# Patient Record
Sex: Female | Born: 1985 | Hispanic: Yes | Marital: Single | Smoking: Never smoker
Health system: Southern US, Community
[De-identification: ages and names within clinical notes are randomized; demographics above are authoritative.]

## PROBLEM LIST (undated history)

## (undated) DIAGNOSIS — K429 Umbilical hernia without obstruction or gangrene: Secondary | ICD-10-CM

## (undated) DIAGNOSIS — O24419 Gestational diabetes mellitus in pregnancy, unspecified control: Secondary | ICD-10-CM

## (undated) HISTORY — PX: NO PAST SURGERIES: SHX2092

## (undated) HISTORY — DX: Umbilical hernia without obstruction or gangrene: K42.9

## (undated) HISTORY — DX: Gestational diabetes mellitus in pregnancy, unspecified control: O24.419

---

## 2007-09-08 ENCOUNTER — Ambulatory Visit (HOSPITAL_COMMUNITY): Admission: RE | Admit: 2007-09-08 | Discharge: 2007-09-08 | Payer: Self-pay | Admitting: Family Medicine

## 2007-12-21 ENCOUNTER — Ambulatory Visit (HOSPITAL_COMMUNITY): Admission: RE | Admit: 2007-12-21 | Discharge: 2007-12-21 | Payer: Self-pay | Admitting: Family Medicine

## 2008-01-03 ENCOUNTER — Inpatient Hospital Stay (HOSPITAL_COMMUNITY): Admission: AD | Admit: 2008-01-03 | Discharge: 2008-01-06 | Payer: Self-pay | Admitting: Obstetrics & Gynecology

## 2008-01-03 ENCOUNTER — Ambulatory Visit: Payer: Self-pay | Admitting: Physician Assistant

## 2011-02-28 LAB — CBC
HCT: 40.2
MCV: 94.5
Platelets: 165
RDW: 13.5

## 2011-02-28 LAB — RPR: RPR Ser Ql: NONREACTIVE

## 2011-06-03 NOTE — L&D Delivery Note (Signed)
Delivery Note At 2:20 AM a viable female was delivered via Vaginal, Spontaneous Delivery (Presentation: ; Left Occiput Anterior).  APGAR: 8, 10; weight 8 lb 6 oz (3799 g).   Placenta status: Intact, Spontaneous.  Cord: 3 vessels   Anesthesia: None  Episiotomy: None Lacerations: 1st degree perineal Suture Repair: 3.0 vicryl Est. Blood Loss (mL): 350  Mom to postpartum.  Baby to nursery-stable.  Cam Hai 11/26/2011, 2:53 AM

## 2011-06-09 ENCOUNTER — Other Ambulatory Visit (HOSPITAL_COMMUNITY): Payer: Self-pay | Admitting: Physician Assistant

## 2011-06-09 DIAGNOSIS — Z3689 Encounter for other specified antenatal screening: Secondary | ICD-10-CM

## 2011-06-09 LAB — OB RESULTS CONSOLE GC/CHLAMYDIA: Chlamydia: NEGATIVE

## 2011-06-09 LAB — OB RESULTS CONSOLE RUBELLA ANTIBODY, IGM: Rubella: IMMUNE

## 2011-06-09 LAB — OB RESULTS CONSOLE HEPATITIS B SURFACE ANTIGEN: Hepatitis B Surface Ag: NEGATIVE

## 2011-06-09 LAB — OB RESULTS CONSOLE ABO/RH: RH Type: POSITIVE

## 2011-06-09 LAB — OB RESULTS CONSOLE RPR: RPR: NONREACTIVE

## 2011-07-11 ENCOUNTER — Ambulatory Visit (HOSPITAL_COMMUNITY)
Admission: RE | Admit: 2011-07-11 | Discharge: 2011-07-11 | Disposition: A | Discharge status: Home / Self Care | Payer: Medicaid Other | Source: Ambulatory Visit | Attending: Physician Assistant | Admitting: Physician Assistant

## 2011-07-11 DIAGNOSIS — O358XX0 Maternal care for other (suspected) fetal abnormality and damage, not applicable or unspecified: Secondary | ICD-10-CM | POA: Insufficient documentation

## 2011-07-11 DIAGNOSIS — Z3689 Encounter for other specified antenatal screening: Secondary | ICD-10-CM

## 2011-07-11 DIAGNOSIS — Z1389 Encounter for screening for other disorder: Secondary | ICD-10-CM | POA: Insufficient documentation

## 2011-07-11 DIAGNOSIS — Z363 Encounter for antenatal screening for malformations: Secondary | ICD-10-CM | POA: Insufficient documentation

## 2011-11-19 ENCOUNTER — Other Ambulatory Visit (HOSPITAL_COMMUNITY): Payer: Self-pay | Admitting: Physician Assistant

## 2011-11-19 DIAGNOSIS — O480 Post-term pregnancy: Secondary | ICD-10-CM

## 2011-11-21 ENCOUNTER — Telehealth (HOSPITAL_COMMUNITY): Payer: Self-pay | Admitting: *Deleted

## 2011-11-21 ENCOUNTER — Encounter (HOSPITAL_COMMUNITY): Payer: Self-pay | Admitting: *Deleted

## 2011-11-21 NOTE — Telephone Encounter (Signed)
[id number] interpreter number Preadmission screen

## 2011-11-24 ENCOUNTER — Ambulatory Visit (HOSPITAL_COMMUNITY)
Admission: RE | Admit: 2011-11-24 | Discharge: 2011-11-24 | Disposition: A | Discharge status: Home / Self Care | Payer: Medicaid Other | Source: Ambulatory Visit | Attending: Physician Assistant | Admitting: Physician Assistant

## 2011-11-24 DIAGNOSIS — O480 Post-term pregnancy: Secondary | ICD-10-CM | POA: Insufficient documentation

## 2011-11-24 DIAGNOSIS — Z3689 Encounter for other specified antenatal screening: Secondary | ICD-10-CM | POA: Insufficient documentation

## 2011-11-26 ENCOUNTER — Encounter (HOSPITAL_COMMUNITY): Payer: Self-pay | Admitting: *Deleted

## 2011-11-26 ENCOUNTER — Inpatient Hospital Stay (HOSPITAL_COMMUNITY): Admission: RE | Admit: 2011-11-26 | Payer: Self-pay | Source: Ambulatory Visit | Admitting: Obstetrics & Gynecology

## 2011-11-26 ENCOUNTER — Inpatient Hospital Stay (HOSPITAL_COMMUNITY)
Admission: AD | Admit: 2011-11-26 | Discharge: 2011-11-28 | DRG: 775 | Disposition: A | Discharge status: Home / Self Care | Payer: Medicaid Other | Source: Ambulatory Visit | Attending: Obstetrics & Gynecology | Admitting: Obstetrics & Gynecology

## 2011-11-26 LAB — TYPE AND SCREEN
ABO/RH(D): O POS
Antibody Screen: NEGATIVE

## 2011-11-26 LAB — CBC
HCT: 39.7 % (ref 36.0–46.0)
Platelets: 141 10*3/uL — ABNORMAL LOW (ref 150–400)
RBC: 4.42 MIL/uL (ref 3.87–5.11)
RDW: 14.3 % (ref 11.5–15.5)
WBC: 9.5 10*3/uL (ref 4.0–10.5)

## 2011-11-26 MED ORDER — FLEET ENEMA 7-19 GM/118ML RE ENEM: 1.0000 | ENEMA | RECTAL | Status: DC | PRN

## 2011-11-26 MED ORDER — NALBUPHINE SYRINGE 5 MG/0.5 ML: 10.0000 mg | INJECTION | INTRAMUSCULAR | Status: DC | PRN

## 2011-11-26 MED ORDER — PRENATAL MULTIVITAMIN CH
1.0000 | ORAL_TABLET | Freq: Every day | ORAL | Status: DC
  Administered 2011-11-26 – 2011-11-28 (×3): 1 via ORAL
  Filled 2011-11-26 (×3): qty 1

## 2011-11-26 MED ORDER — IBUPROFEN 600 MG PO TABS
600.0000 mg | ORAL_TABLET | Freq: Four times a day (QID) | ORAL | Status: DC | PRN
  Administered 2011-11-26: 600 mg via ORAL
  Filled 2011-11-26: qty 1

## 2011-11-26 MED ORDER — OXYCODONE-ACETAMINOPHEN 5-325 MG PO TABS
1.0000 | ORAL_TABLET | ORAL | Status: DC | PRN
  Administered 2011-11-26: 1 via ORAL
  Filled 2011-11-26: qty 1

## 2011-11-26 MED ORDER — LACTATED RINGERS IV SOLN
INTRAVENOUS | Status: DC
  Administered 2011-11-26: 01:00:00 via INTRAVENOUS

## 2011-11-26 MED ORDER — ACETAMINOPHEN 325 MG PO TABS: 650.0000 mg | ORAL_TABLET | ORAL | Status: DC | PRN

## 2011-11-26 MED ORDER — BENZOCAINE-MENTHOL 20-0.5 % EX AERO
1.0000 "application " | INHALATION_SPRAY | CUTANEOUS | Status: DC | PRN
  Filled 2011-11-26: qty 56

## 2011-11-26 MED ORDER — OXYTOCIN 40 UNITS IN LACTATED RINGERS INFUSION - SIMPLE MED: 62.5000 mL/h | Freq: Once | INTRAVENOUS | Status: DC

## 2011-11-26 MED ORDER — ZOLPIDEM TARTRATE 5 MG PO TABS: 5.0000 mg | ORAL_TABLET | Freq: Every evening | ORAL | Status: DC | PRN

## 2011-11-26 MED ORDER — DIPHENHYDRAMINE HCL 25 MG PO CAPS: 25.0000 mg | ORAL_CAPSULE | Freq: Four times a day (QID) | ORAL | Status: DC | PRN

## 2011-11-26 MED ORDER — SENNOSIDES-DOCUSATE SODIUM 8.6-50 MG PO TABS
2.0000 | ORAL_TABLET | Freq: Every day | ORAL | Status: DC
  Administered 2011-11-26 – 2011-11-27 (×2): 2 via ORAL

## 2011-11-26 MED ORDER — ONDANSETRON HCL 4 MG/2ML IJ SOLN: 4.0000 mg | INTRAMUSCULAR | Status: DC | PRN

## 2011-11-26 MED ORDER — SIMETHICONE 80 MG PO CHEW: 80.0000 mg | CHEWABLE_TABLET | ORAL | Status: DC | PRN

## 2011-11-26 MED ORDER — OXYTOCIN BOLUS FROM INFUSION
250.0000 mL | Freq: Once | INTRAVENOUS | Status: DC
  Filled 2011-11-26: qty 500

## 2011-11-26 MED ORDER — LACTATED RINGERS IV SOLN: 500.0000 mL | INTRAVENOUS | Status: DC | PRN

## 2011-11-26 MED ORDER — LANOLIN HYDROUS EX OINT: TOPICAL_OINTMENT | CUTANEOUS | Status: DC | PRN

## 2011-11-26 MED ORDER — ONDANSETRON HCL 4 MG/2ML IJ SOLN: 4.0000 mg | Freq: Four times a day (QID) | INTRAMUSCULAR | Status: DC | PRN

## 2011-11-26 MED ORDER — ONDANSETRON HCL 4 MG PO TABS: 4.0000 mg | ORAL_TABLET | ORAL | Status: DC | PRN

## 2011-11-26 MED ORDER — CITRIC ACID-SODIUM CITRATE 334-500 MG/5ML PO SOLN: 30.0000 mL | ORAL | Status: DC | PRN

## 2011-11-26 MED ORDER — DIBUCAINE 1 % RE OINT: 1.0000 "application " | TOPICAL_OINTMENT | RECTAL | Status: DC | PRN

## 2011-11-26 MED ORDER — TETANUS-DIPHTH-ACELL PERTUSSIS 5-2.5-18.5 LF-MCG/0.5 IM SUSP
0.5000 mL | Freq: Once | INTRAMUSCULAR | Status: AC
  Administered 2011-11-27: 0.5 mL via INTRAMUSCULAR
  Filled 2011-11-26: qty 0.5

## 2011-11-26 MED ORDER — WITCH HAZEL-GLYCERIN EX PADS: 1.0000 "application " | MEDICATED_PAD | CUTANEOUS | Status: DC | PRN

## 2011-11-26 MED ORDER — LIDOCAINE HCL (PF) 1 % IJ SOLN
INTRAMUSCULAR | Status: AC
  Filled 2011-11-26: qty 30

## 2011-11-26 MED ORDER — IBUPROFEN 600 MG PO TABS
600.0000 mg | ORAL_TABLET | Freq: Four times a day (QID) | ORAL | Status: DC
  Administered 2011-11-26 – 2011-11-28 (×8): 600 mg via ORAL
  Filled 2011-11-26 (×9): qty 1

## 2011-11-26 MED ORDER — LIDOCAINE HCL (PF) 1 % IJ SOLN: 30.0000 mL | INTRAMUSCULAR | Status: DC | PRN

## 2011-11-26 MED ORDER — OXYTOCIN 40 UNITS IN LACTATED RINGERS INFUSION - SIMPLE MED
INTRAVENOUS | Status: AC
  Administered 2011-11-26: 40 [IU]
  Filled 2011-11-26: qty 1000

## 2011-11-26 NOTE — H&P (Signed)
S: This is a 26 year old G2P1001 at [redacted]w[redacted]d by LMP presenting with contractions. Patient began developing ctx last night approx. 2230 that have progressed to 4-5 min apart lasting for approximately 1 minute. She was to be induced on 11/26/11 @ 0600. She presents to the MAU with spontaneous onset of labor with no ROM.  Complications this pregnancy: none  Contractions: q48min Membranes: intact Vaginal bleeding: none Vaginal discharge: mucous Fetal movement: present  O:  Filed Vitals:   11/26/11 0101  BP: 116/78  Pulse: 83  Temp: 97.4 F (36.3 C)  Resp: 16    Physical Examination: General appearance - alert, well appearing, and in no distress Mental status - alert, oriented to person, place, and time Eyes - pupils equal and reactive, extraocular eye movements intact Mouth - mucous membranes moist, pharynx normal without lesions Chest - clear to auscultation, no wheezes, rales or rhonchi, symmetric air entry Heart - normal rate, regular rhythm, normal S1, S2, no murmurs, rubs, clicks or gallops Abdomen - gravid, size c/w dates, soft, nontender, no masses or organomegaly Pelvic - NEFG, cervix fully dilated and effaced, station -1 Back exam - full range of motion, no tenderness, palpable spasm or pain on motion Neurological - alert, oriented, normal speech, no focal findings or movement disorder noted Musculoskeletal - no joint tenderness, deformity or swelling Extremities - pedal edema 2 + pitting B/L Skin - normal coloration and turgor, no rashes, no suspicious skin lesions noted  SVE: cervix 10cm, 100%, -1 Spec exam: not performed FHT: 151, mod variability, + accels, no decels Ctx: q3-4 min  Prenatal labs: ABO, Rh:  O positive Antibody:  Neg Rubella:  Immune RPR:   NR HBsAg: Neg  HIV:   Neg GBS:   Negative Hgb/Plt: pending 1hr GTT: 104 (normal) 1st trimester screen: QUAD  negative  Labs:  No results found for this or any previous visit (from the past 24  hour(s)).    A/P: 26 year old G2P1001 @ [redacted]w[redacted]d presents with spontaneous onset of labor 1. Admit to L&D 2. Expectant management, anticipate vaginal delivery 3. Antibiotics, none 4. Continuous toco/FHT  I have seen and examined this patient and I agree with the above. Cam Hai 3:14 AM 11/26/2011

## 2011-11-26 NOTE — H&P (Signed)
Attestation of Attending Supervision of Advanced Practitioner (CNM/NP): Evaluation and management procedures were performed by the Advanced Practitioner under my supervision and collaboration.  I have reviewed the Advanced Practitioner's note and chart, and I agree with the management and plan.  Couper Juncaj, M.D. 11/26/2011 7:42 AM  

## 2011-11-26 NOTE — MAU Note (Signed)
Pt. Started having uc's about 10:30 last night, now her uc's are 4-5 minutes apart. Pt. States that she was 3 cm in the office last week.

## 2011-11-26 NOTE — Progress Notes (Signed)
Ur chart review completed.  

## 2011-11-27 MED ORDER — IBUPROFEN 600 MG PO TABS: 600.0000 mg | ORAL_TABLET | Freq: Four times a day (QID) | ORAL | Status: AC | PRN

## 2011-11-27 NOTE — Progress Notes (Signed)
I have seen/examined this patient and agree with the above.  Caroline Pietsch E.  

## 2011-11-27 NOTE — Progress Notes (Signed)
S: Pt doing well. Pain controlled: Yes. Lochia: decreased.  Eating/drinking: Yes. Flatus: Yes. BM: No: . Voiding: Yes. Ambulating: No: . Breast feeding well: Yes.   O: Filed Vitals:   11/27/11 0525  BP: 94/56  Pulse: 70  Temp: 98.1 F (36.7 C)  Resp: 20    Gen: NAD, doing well CV: RRR Pulm: CTAB Abd: soft, + bowel sounds, fundus firm Ext: no edema  A/P: 26 y.o. year old G2P2002 PPD# 1 s/p SVD w/o complications -female/ breast/ birth control: IUD placement -Continue routine post-partum care. -Anticipate d/c PPD #2 -F/u in 6 weeks at HD

## 2011-11-27 NOTE — Discharge Instructions (Signed)
Cuidados luego de un parto por vía vaginal °(Postpartum Care After Vaginal Delivery) °Luego del nacimiento del bebé deberá permanecer en el hospital durante 24 a 72 horas, excepto que hubiera existido algún problema, o usted sufra alguna enfermedad. Mientras se encuentre en el hospital recibirá ayuda e instrucciones por parte de las enfermeras y el médico, quienes cuidarán de usted y su bebé y le darán consejos para amamantarlo correctamente, especialmente si es el primer hijo.  °En caso de ser necesario, le prescribirán analgésicos. Observará una pequeña hemorragia vaginal y deberá cambiar los apósitos con frecuencia. Lávese las manos cuidadosamente con agua y jabón durante al menos 20 segundos luego de cambiarse el apósito o ir al baño. Si elimina coágulos o aumenta la hemorragia, infórmelo a la enfermera. No deseche los coágulos sanguíneos antes de mostrárselos a la enfermera, para asegurarse de que no es tejido placentario. °Si le han colocado una vía intravenosa, se la retirarán dentro de las 24 horas, si no hay problemas. La primera vez que se levante de la cama o tome una ducha, llame a la enfermera para que la ayude que puede sentirse débil, mareada o desmayarse. Si está amamantando, puede sentir contracciones dolorosas en el útero durante algunas semanas. Esto es normal y necesario, ya que de este modo el útero vuelve a su tamaño normal. Si no está amamantando, utilice un sostén de soporte y trate de no tocarse las mamas hasta que haya dejado de producir leche. No deben administrarse hormonas para suprimir la leche, debido a que pueden causar coágulos sanguíneos. Podrá seguir una dieta normal, excepto que sufra diabetes o presente otros problemas de salud.  °La enfermera colocará bolsas con hielo en el sitio de la episiotomía (agrandamiento quirúrgico de la apertura vaginal) para reducir el dolor y la hinchazón. En algunos casos raros hay dificultad para orinar, entonces la enfermera deberá vaciarle la  vejiga con un catéter. Si le han practicado una ligadura tubaria durante el posparto ("trompas atadas", esterilización femenina), esto no hará que permanezca más tiempo en el hospital. °Podrá tener al bebé en su habitación todo el tiempo que lo desee si el bebé no tiene ningún problema. Lleve y traiga al bebé de la nursery dentro de la cunita. No lo lleve en brazos. No abandone el área de posparto. Si la madre es Rh negativa (falta de una proteína en los glóbulos rojos) y el bebé es Rh positivo, la madre debe aplicarse la vacuna RhoGam para evitar problemas con el factor Rh en futuros embarazos °Le darán instrucciones por escrito para usted y el bebé y los medicamentos necesarios cuando reciba el alta médica. Asegúrese que comprende y sigue las indicaciones. °INSTRUCCIONES PARA EL CUIDADO DOMICILIARIO °· Siga las instrucciones y tome los medicamentos que le indicaron cuando le dieron el alta médica.  °· Utilice los medicamentos de venta libre o de prescripción para el dolor, el malestar o la fiebre, según se lo indique el profesional que lo asiste.  °· No tome aspirina, ya que puede causar hemorragias.  °· Aumente sus actividades un poco cada día para tener más fuerza y resistencia.  °· No beba alcohol, especialmente si está amamantando o toma analgésicos.  °· Tómese la temperatura dos veces por día y regístrela.  °· Podrá tener una pequeña hemorragia durante 2 a 4 semanas. Esto es normal.  °· No utilice tampones o duchas vaginales, use toallas higiénicas.  °· Trate de que alguna persona permanezca con usted y la ayude durante los primeros días en el hogar.  °·   Descanse o duerma una siesta cuando el bebé duerma.  °· Si está amamantando, use un buen sostén. Si no está amamantando, use un buen sostén y no estimule los pezones.  °· Consuma una dieta sana y siga tomando las vitaminas prenatales.  °· No conduzca vehículos, no realice actividades pesadas ni viaje hasta que su médico la autorice.  °· No mantenga relaciones  sexuales hasta que el médico lo permita.  °· Consulte con el profesional cuando puede comenzar a realizar actividad física y que tipo de ejercicios puede hacer.  °· Comuníquese inmediatamente con el médico si tiene problemas luego del parto.  °· Comuníquese con el pediatra si tiene problemas con el bebé.  °· Programe su visita de control luego del parto y cúmplala.  °SOLICITE ATENCIÓN MÉDICA SI: °· La temperatura se eleva por encima de 100° F (37.8° C).  °· Aumenta la hemorragia vaginal o elimina coágulos. Conserve algunos coágulos para mostrárselos al médico.  °· Observa sangre o siente dolor al orinar.  °· Presenta secreción vaginal con olor fétido.  °· Aumenta el dolor o la inflamación en el sitio de la episiotomía (agrandamiento quirúrgico de la apertura vaginal).  °· Sufre una cefalea grave.  °· Se siente deprimida.  °· La incisión se abre.  °· Se siente mareada o sufre un desmayo.  °· Aparece una erupción cutánea.  °· Tiene una reacción o problemas con su medicamento.  °· Siente dolor u observa enrojecimiento e hinchazón en el sitio de la vía intravenosa.  °SOLICITE ATENCIÓN MÉDICA DE INMEDIATO SI: °· Siente dolor en el pecho.  °· Comienza a sentir falta de aire.  °· Se desmaya.  °· Siente dolor, con o sin hinchazón e irritación en la pierna.  °· Tiene una hemorragia vaginal abundante, con o sin coágulos  °· Siente dolor en el estómago.  °· Observa una secreción vaginal con mal olor.  °ASEGURESE QUE:  °· Comprende estas instrucciones.  °· Controlará su enfermedad.  °· Solicitará ayuda de inmediato si no mejora o empeora.  °Document Released: 03/16/2007 Document Revised: 05/08/2011 °ExitCare® Patient Information ©2012 [employer]. °

## 2011-11-28 ENCOUNTER — Encounter (HOSPITAL_COMMUNITY): Payer: Self-pay

## 2011-11-28 NOTE — Discharge Summary (Signed)
Physician Discharge Summary  Patient ID: Caroline [name] MRN: [id number] DOB/AGE: 1985-06-24 25 y.o.  Admit date: 11/26/2011 Discharge date: 11/28/2011  Admission Diagnoses: SOL  Discharge Diagnoses: s/p NSVD   Hospital Course: Patient is a 26 yo woman, G2P2002, who presented in MAU on 11/26/11 with SOL with no ROM. She denied an epidural. The patient was transferred to L&D where she gave birth to a healthy female infant via Vaginal, Spontaneous Delivery (Presentation: ; Left Occiput Anterior). APGAR: 8, 10; weight 8 lb 6 oz (3799 g). Placenta status: Intact, Spontaneous. Cord: 3 vessels. Estimated blood loss was approximately 350 mL. Patient sustained 1st degree perineal laceration repaired with 3.0 vicryl sutures.   Discharge Exam: Blood pressure 106/68, pulse 67, temperature 98.5 F (36.9 C), temperature source Oral, resp. rate 20, height 5\' 3"  (1.6 m), weight 81.307 kg (179 lb 4 oz), last menstrual period 02/20/2011, SpO2 97.00%,currently breastfeeding. General appearance: alert, cooperative and no distress Head: Normocephalic, without obvious abnormality, atraumatic Eyes: conjunctivae/corneas clear. PERRL, EOM's intact. Fundi benign. Back: symmetric, no curvature. ROM normal. No CVA tenderness. Resp: clear to auscultation bilaterally Chest wall: no tenderness Cardio: regular rate and rhythm, S1, S2 normal, no murmur, click, rub or gallop GI: fundus firm, abdomen soft, non-tender; bowel sounds normal; no masses,  no organomegaly Extremities: extremities normal, atraumatic, no cyanosis or edema Skin: Skin color, texture, turgor normal. No rashes or lesions   Results for orders placed during the hospital encounter of 11/26/11 (from the past 72 hour(s))  CBC     Status: Abnormal   Collection Time   11/26/11  1:05 AM      Component Value Range Comment   WBC 9.5  4.0 - 10.5 K/uL    RBC 4.42  3.87 - 5.11 MIL/uL    Hemoglobin 13.3  12.0 - 15.0 g/dL    HCT 40.9  81.1 - 91.4 %    MCV 89.8  78.0 - 100.0 fL    MCH 30.1  26.0 - 34.0 pg    MCHC 33.5  30.0 - 36.0 g/dL    RDW 78.2  95.6 - 21.3 %    Platelets 141 (*) 150 - 400 K/uL   RPR     Status: Normal   Collection Time   11/26/11  1:05 AM      Component Value Range Comment   RPR NON REACTIVE  NON REACTIVE   TYPE AND SCREEN     Status: Normal   Collection Time   11/26/11  1:05 AM      Component Value Range Comment   ABO/RH(D) O POS      Antibody Screen NEG      Sample Expiration 11/29/2011     ABO/RH     Status: Normal   Collection Time   11/26/11  1:05 AM      Component Value Range Comment   ABO/RH(D) O POS      Disposition:    Medication List  As of 11/28/2011  7:51 AM   TAKE these medications         ibuprofen 600 MG tablet   Commonly known as: ADVIL,MOTRIN   Take 1 tablet (600 mg total) by mouth every 6 (six) hours as needed for pain.      prenatal multivitamin Tabs   Take 1 tablet by mouth daily.           Follow-up Information    Schedule an appointment as soon as possible for a visit with [facility] [place]. (  Follow up in 4-6 weeks)    Contact information:   [address] [facility] [address]          Signed: Lewie Chamber 11/28/2011, 7:51 AM

## 2011-12-02 NOTE — Discharge Summary (Signed)
Agree with above note.  Caroline [name] H. 12/02/2011 8:19 PM  

## 2014-03-09 ENCOUNTER — Other Ambulatory Visit (HOSPITAL_COMMUNITY): Payer: Self-pay | Admitting: Physician Assistant

## 2014-03-09 DIAGNOSIS — Z3689 Encounter for other specified antenatal screening: Secondary | ICD-10-CM

## 2014-03-09 LAB — OB RESULTS CONSOLE RPR: RPR: NONREACTIVE

## 2014-03-09 LAB — OB RESULTS CONSOLE HIV ANTIBODY (ROUTINE TESTING): HIV: NONREACTIVE

## 2014-03-09 LAB — OB RESULTS CONSOLE GC/CHLAMYDIA
Chlamydia: NEGATIVE
Gonorrhea: NEGATIVE

## 2014-03-17 ENCOUNTER — Ambulatory Visit (HOSPITAL_COMMUNITY)
Admission: RE | Admit: 2014-03-17 | Discharge: 2014-03-17 | Disposition: A | Discharge status: Home / Self Care | Payer: Medicaid Other | Source: Ambulatory Visit | Attending: Physician Assistant | Admitting: Physician Assistant

## 2014-03-17 DIAGNOSIS — Z3A21 21 weeks gestation of pregnancy: Secondary | ICD-10-CM | POA: Diagnosis not present

## 2014-03-17 DIAGNOSIS — Z36 Encounter for antenatal screening of mother: Secondary | ICD-10-CM | POA: Insufficient documentation

## 2014-03-17 DIAGNOSIS — Z3689 Encounter for other specified antenatal screening: Secondary | ICD-10-CM

## 2014-04-03 ENCOUNTER — Encounter (HOSPITAL_COMMUNITY): Payer: Self-pay

## 2014-06-02 NOTE — L&D Delivery Note (Signed)
Delivery Note At 7:57 AM a viable and healthy female was delivered via Vaginal, Spontaneous Delivery (Presentation: ;LOA  ).  APGAR: 9, 9; weight  .   Placenta status: delivered, intact.  Cord: 3 vessel with the following complications: none.   Anesthesia:  none Episiotomy:  none Lacerations:  Small periurethral, hemostatic Suture Repair: n/a Est. Blood Loss (mL):  200mL  Vigorous female infant delivered over intact perineum. Light meconium present. Immediate cry, placed on maternal abdomen. Cord clamped and cut. Placenta delivered with fundal massage.  Mom to postpartum.  Baby to Couplet care / Skin to Skin.  Beverely Lowdamo, Elena 07/31/2014, 8:14 AM

## 2014-07-10 LAB — OB RESULTS CONSOLE GBS: GBS: NEGATIVE

## 2014-07-31 ENCOUNTER — Encounter (HOSPITAL_COMMUNITY): Payer: Self-pay | Admitting: *Deleted

## 2014-07-31 ENCOUNTER — Inpatient Hospital Stay (HOSPITAL_COMMUNITY)
Admission: AD | Admit: 2014-07-31 | Discharge: 2014-08-01 | DRG: 775 | Disposition: A | Discharge status: Home / Self Care | Payer: Medicaid Other | Source: Ambulatory Visit | Attending: Obstetrics & Gynecology | Admitting: Obstetrics & Gynecology

## 2014-07-31 DIAGNOSIS — Z3A00 Weeks of gestation of pregnancy not specified: Secondary | ICD-10-CM | POA: Diagnosis present

## 2014-07-31 DIAGNOSIS — IMO0001 Reserved for inherently not codable concepts without codable children: Secondary | ICD-10-CM

## 2014-07-31 LAB — CBC
HEMATOCRIT: 40.8 % (ref 36.0–46.0)
Hemoglobin: 13.8 g/dL (ref 12.0–15.0)
MCH: 31.2 pg (ref 26.0–34.0)
MCHC: 33.8 g/dL (ref 30.0–36.0)
MCV: 92.1 fL (ref 78.0–100.0)
Platelets: 122 10*3/uL — ABNORMAL LOW (ref 150–400)
RBC: 4.43 MIL/uL (ref 3.87–5.11)
RDW: 14.7 % (ref 11.5–15.5)
WBC: 6.9 10*3/uL (ref 4.0–10.5)

## 2014-07-31 LAB — RPR: RPR Ser Ql: NONREACTIVE

## 2014-07-31 MED ORDER — LIDOCAINE HCL (PF) 1 % IJ SOLN
INTRAMUSCULAR | Status: AC
  Filled 2014-07-31: qty 30

## 2014-07-31 MED ORDER — LACTATED RINGERS IV SOLN: 500.0000 mL | Freq: Once | INTRAVENOUS | Status: DC

## 2014-07-31 MED ORDER — ACETAMINOPHEN 325 MG PO TABS: 650.0000 mg | ORAL_TABLET | ORAL | Status: DC | PRN

## 2014-07-31 MED ORDER — OXYCODONE-ACETAMINOPHEN 5-325 MG PO TABS: 2.0000 | ORAL_TABLET | ORAL | Status: DC | PRN

## 2014-07-31 MED ORDER — TETANUS-DIPHTH-ACELL PERTUSSIS 5-2.5-18.5 LF-MCG/0.5 IM SUSP
0.5000 mL | Freq: Once | INTRAMUSCULAR | Status: DC
Start: 2014-08-01 — End: 2014-08-01

## 2014-07-31 MED ORDER — OXYCODONE-ACETAMINOPHEN 5-325 MG PO TABS: 1.0000 | ORAL_TABLET | ORAL | Status: DC | PRN

## 2014-07-31 MED ORDER — BENZOCAINE-MENTHOL 20-0.5 % EX AERO
1.0000 "application " | INHALATION_SPRAY | CUTANEOUS | Status: DC | PRN
  Filled 2014-07-31: qty 56

## 2014-07-31 MED ORDER — LACTATED RINGERS IV SOLN: 500.0000 mL | INTRAVENOUS | Status: DC | PRN

## 2014-07-31 MED ORDER — DIPHENHYDRAMINE HCL 50 MG/ML IJ SOLN: 12.5000 mg | INTRAMUSCULAR | Status: DC | PRN

## 2014-07-31 MED ORDER — DIPHENHYDRAMINE HCL 25 MG PO CAPS: 25.0000 mg | ORAL_CAPSULE | Freq: Four times a day (QID) | ORAL | Status: DC | PRN

## 2014-07-31 MED ORDER — DIBUCAINE 1 % RE OINT
1.0000 "application " | TOPICAL_OINTMENT | RECTAL | Status: DC | PRN
  Filled 2014-07-31: qty 28

## 2014-07-31 MED ORDER — LANOLIN HYDROUS EX OINT: TOPICAL_OINTMENT | CUTANEOUS | Status: DC | PRN

## 2014-07-31 MED ORDER — ONDANSETRON HCL 4 MG/2ML IJ SOLN: 4.0000 mg | INTRAMUSCULAR | Status: DC | PRN

## 2014-07-31 MED ORDER — IBUPROFEN 600 MG PO TABS
600.0000 mg | ORAL_TABLET | Freq: Four times a day (QID) | ORAL | Status: DC
  Administered 2014-07-31 – 2014-08-01 (×5): 600 mg via ORAL
  Filled 2014-07-31 (×5): qty 1

## 2014-07-31 MED ORDER — BUTORPHANOL TARTRATE 1 MG/ML IJ SOLN: 1.0000 mg | INTRAMUSCULAR | Status: DC | PRN

## 2014-07-31 MED ORDER — LIDOCAINE HCL (PF) 1 % IJ SOLN: 30.0000 mL | INTRAMUSCULAR | Status: DC | PRN

## 2014-07-31 MED ORDER — OXYTOCIN BOLUS FROM INFUSION
500.0000 mL | INTRAVENOUS | Status: DC
  Administered 2014-07-31: 500 mL via INTRAVENOUS

## 2014-07-31 MED ORDER — FENTANYL CITRATE 0.05 MG/ML IJ SOLN: 100.0000 ug | INTRAMUSCULAR | Status: DC | PRN

## 2014-07-31 MED ORDER — OXYTOCIN 40 UNITS IN LACTATED RINGERS INFUSION - SIMPLE MED
INTRAVENOUS | Status: AC
  Filled 2014-07-31: qty 1000

## 2014-07-31 MED ORDER — PHENYLEPHRINE 40 MCG/ML (10ML) SYRINGE FOR IV PUSH (FOR BLOOD PRESSURE SUPPORT): 80.0000 ug | PREFILLED_SYRINGE | INTRAVENOUS | Status: DC | PRN

## 2014-07-31 MED ORDER — SENNOSIDES-DOCUSATE SODIUM 8.6-50 MG PO TABS
2.0000 | ORAL_TABLET | ORAL | Status: DC
Start: 2014-08-01 — End: 2014-08-01
  Administered 2014-07-31: 2 via ORAL
  Filled 2014-07-31: qty 2

## 2014-07-31 MED ORDER — CITRIC ACID-SODIUM CITRATE 334-500 MG/5ML PO SOLN: 30.0000 mL | ORAL | Status: DC | PRN

## 2014-07-31 MED ORDER — FENTANYL 2.5 MCG/ML BUPIVACAINE 1/10 % EPIDURAL INFUSION (WH - ANES): 14.0000 mL/h | INTRAMUSCULAR | Status: DC | PRN

## 2014-07-31 MED ORDER — LACTATED RINGERS IV SOLN
INTRAVENOUS | Status: DC
  Administered 2014-07-31: 08:00:00 via INTRAVENOUS

## 2014-07-31 MED ORDER — OXYTOCIN 40 UNITS IN LACTATED RINGERS INFUSION - SIMPLE MED: 62.5000 mL/h | INTRAVENOUS | Status: DC

## 2014-07-31 MED ORDER — WITCH HAZEL-GLYCERIN EX PADS: 1.0000 "application " | MEDICATED_PAD | CUTANEOUS | Status: DC | PRN

## 2014-07-31 MED ORDER — PRENATAL MULTIVITAMIN CH
1.0000 | ORAL_TABLET | Freq: Every day | ORAL | Status: DC
  Administered 2014-08-01: 1 via ORAL
  Filled 2014-07-31 (×2): qty 1

## 2014-07-31 MED ORDER — SIMETHICONE 80 MG PO CHEW: 80.0000 mg | CHEWABLE_TABLET | ORAL | Status: DC | PRN

## 2014-07-31 MED ORDER — ONDANSETRON HCL 4 MG PO TABS: 4.0000 mg | ORAL_TABLET | ORAL | Status: DC | PRN

## 2014-07-31 MED ORDER — EPHEDRINE 5 MG/ML INJ: 10.0000 mg | INTRAVENOUS | Status: DC | PRN

## 2014-07-31 MED ORDER — ONDANSETRON HCL 4 MG/2ML IJ SOLN: 4.0000 mg | Freq: Four times a day (QID) | INTRAMUSCULAR | Status: DC | PRN

## 2014-07-31 MED ORDER — ZOLPIDEM TARTRATE 5 MG PO TABS: 5.0000 mg | ORAL_TABLET | Freq: Every evening | ORAL | Status: DC | PRN

## 2014-07-31 NOTE — Lactation Note (Signed)
This note was copied from the chart of Caroline [name]. Lactation Consultation Note  Patient Name: Caroline [name] ZOXWR'UToday's Date: 07/31/2014 Reason for consult: Initial assessment   Initial consult at 9 hours old; GA 40.3; BW 7 lbs, 8.5 oz.  Infant has breastfed x2 (10-12 min) according to chart but mom said only once late this morning.  Voids-1; stools-1 in first 9 hours of life. Infant very sleepy.  Too sleepy to latch and mom was concerned that it has been 7 hours since baby last fed despite her attempts with feedings.  Suggested trying to spoon feed and mom agreed.  She wanted the baby to eat. LC reviewed hand expression with lots colostrum easily flowing.  Taught dad how to spoon feed.  Spoon fed 3 ml colostrum from hand expression then baby awoke and latched.   Taught asymmetrical latching technique using football hold skin-to-skin. Infant easily latched with depth and flanged lips with rhythmical sucking; swallows heard; LS-8. Encouraged mom to feed with feeding cues and if it has been several hours since last latching to put baby STS and hand express into his mouth or spoon feed to entice to awaken as he did with current latching. Colostrum collection container, curved-tip syringe, and spoons given to parents for collecting and spoon feeding EBM as needed. Lactation brochure given and informed of outpatient services.   Maternal Data Has patient been taught Hand Expression?: Yes (lots colostrum hand expressed for spoon feeding) Does the patient have breastfeeding experience prior to this delivery?: Yes   Lactation Tools Discussed/Used WIC Program: Yes   Consult Status Consult Status: Follow-up Date: 08/01/14 Follow-up type: In-patient    Lendon KaVann, Ottilie Wigglesworth Walker 07/31/2014, 5:39 PM

## 2014-07-31 NOTE — MAU Note (Signed)
Out to get patient and patient has gone down hallway to change her baby.

## 2014-07-31 NOTE — Progress Notes (Signed)
UR chart review completed.  

## 2014-07-31 NOTE — Progress Notes (Signed)
I ordered patient's lunch.  Caroline [name]  Interpreter. °

## 2014-07-31 NOTE — H&P (Signed)
Caroline [name] is a 29 y.o. female presenting for painful contractions. Small amount of bloody show. No LOF. Normal FM  Maternal Medical History:  Reason for admission: Contractions.   Contractions: Onset was 1-2 hours ago.   Frequency: regular.   Perceived severity is moderate.    Fetal activity: Perceived fetal activity is normal.   Last perceived fetal movement was within the past hour.    Prenatal complications: No bleeding, PIH or pre-eclampsia.   Prenatal Complications - Diabetes: none.    OB History    Gravida Para Term Preterm AB TAB SAB Ectopic Multiple Living   3 2 2       2      Past Medical History  Diagnosis Date  . No pertinent past medical history    Past Surgical History  Procedure Laterality Date  . No past surgeries     Family History: family history is not on file. Social History:  reports that she has never smoked. She has never used smokeless tobacco. She reports that she does not drink alcohol or use illicit drugs.   Prenatal Transfer Tool  Maternal Diabetes: No Genetic Screening: Normal Maternal Ultrasounds/Referrals: Normal Fetal Ultrasounds or other Referrals:  None Maternal Substance Abuse:  No Significant Maternal Medications:  None Significant Maternal Lab Results:  Lab values include: Group B Strep negative Other Comments:  None  ROS  Dilation: 7.5 Effacement (%): 100 Station: -2 Exam by:: cheryl motte, rn Blood pressure 133/80, pulse 67, temperature 98.1 F (36.7 C), temperature source Oral, resp. rate 18, last menstrual period 10/21/2013, unknown if currently breastfeeding. Maternal Exam:  Uterine Assessment: Contraction strength is moderate.  Contraction duration is 60 seconds. Contraction frequency is regular.   Abdomen: Patient reports no abdominal tenderness. Introitus: Vagina is negative for discharge.    Fetal Exam Fetal Monitor Review: Mode: ultrasound.   Baseline rate: 135.  Variability: moderate (6-25 bpm).    Pattern: accelerations present and early decelerations.    Fetal State Assessment: Category I - tracings are normal.     Physical Exam  Nursing note and vitals reviewed. Constitutional: She is oriented to person, place, and time. She appears well-developed and well-nourished. No distress.  HENT:  Head: Normocephalic and atraumatic.  Eyes: Conjunctivae are normal. Right eye exhibits no discharge. Left eye exhibits no discharge. No scleral icterus.  Cardiovascular: Normal rate.   Respiratory: Effort normal. No respiratory distress.  GI: There is no tenderness.  Genitourinary: Vagina normal and uterus normal. No vaginal discharge found.  Neurological: She is alert and oriented to person, place, and time.  Skin: Skin is warm and dry. No rash noted. She is not diaphoretic.  Psychiatric: She has a normal mood and affect. Her behavior is normal.    Prenatal labs: ABO, Rh:  O+ Antibody:  Negative Rubella:  Immune RPR:   Negative HBsAg:   Negative HIV:   Negative GBS: Negative (02/08 0000)   Assessment/Plan: Active labor at term  #Labor: expectant management, will monitor progress and augment if needed #Pain: planning iv pain meds #FWB: cat 1 #ID: GBS neg     Beverely Lowdamo, Vlasta Baskin 07/31/2014, 7:30 AM

## 2014-07-31 NOTE — Progress Notes (Signed)
I stopped by patient's room to check on her needs, I ordered her dinner,snack, and breakfast. I also assisted Water engineerAngela RN Assistant Manager from L&D with some questions and Psychologist, clinicalKris RN as well, by Orlan LeavensViria Alvarez Spanish Interpreter.

## 2014-07-31 NOTE — Progress Notes (Signed)
I assisted Jane,RN with questions. Eda H Royal  Interpreter.

## 2014-07-31 NOTE — Progress Notes (Signed)
I assisted Marissa, RN with questions.  Eda H Royal Interpreter. °

## 2014-07-31 NOTE — Progress Notes (Signed)
Assessment and admission paperwork completed with patient, FOB, and Administratorda Royal Interpreter.   [name], Caroline Lauder M

## 2014-08-01 ENCOUNTER — Other Ambulatory Visit: Payer: Medicaid Other

## 2014-08-01 ENCOUNTER — Encounter (HOSPITAL_COMMUNITY): Payer: Self-pay | Admitting: *Deleted

## 2014-08-01 MED ORDER — DOCUSATE SODIUM 100 MG PO CAPS: 100.0000 mg | ORAL_CAPSULE | Freq: Two times a day (BID) | ORAL | Status: DC

## 2014-08-01 MED ORDER — IBUPROFEN 600 MG PO TABS: 600.0000 mg | ORAL_TABLET | Freq: Four times a day (QID) | ORAL | Status: DC

## 2014-08-01 NOTE — Progress Notes (Signed)
I assisted Marissa, RN with discharge instructions. Eda H Royal  Interpreter.

## 2014-08-01 NOTE — Discharge Instructions (Signed)

## 2014-08-01 NOTE — Discharge Summary (Signed)
Obstetric Discharge Summary Reason for Admission: onset of labor Prenatal Procedures: NST Intrapartum Procedures: spontaneous vaginal delivery Postpartum Procedures: none Complications-Operative and Postpartum: small periuretrhral laceration  Delivery Note At 7:57 AM a viable female was delivered via Vaginal, Spontaneous Delivery (Presentation: Left Occiput Anterior).  APGAR: 9, 9; weight 7 lb 8.5 oz (3416 g).   Placenta status: Intact, Spontaneous.  Cord: 3 vessels with the following complications: None.    Anesthesia: None  Episiotomy: None Lacerations: Periurethral Suture Repair: none Est. Blood Loss (mL): 200  Mom to postpartum.  Baby to Couplet care / Skin to Skin.   Hospital Course:  Active Problems:   Active labor at term   Caroline [name] is a 29 y.o. [id number] s/p NSVD.  Patient presented to [facility] on 2/29 with contractions and was admitted to L&D for labor. Her course labor and delivery were uncomplicated.  She has postpartum course that was uncomplicated including no problems with ambulating, PO intake, urination, pain, or bleeding. The pt feels ready to go home and  will be discharged with outpatient follow-up.   Today: No acute events overnight.  Pt denies problems with ambulating, voiding or po intake.  She denies nausea or vomiting.  Pain is well controlled.  She has had flatus. She has not had bowel movement.  Lochia Small.  Plan for birth control is  IUD.  Method of Feeding: breast   H/H: Lab Results  Component Value Date/Time   HGB 13.8 07/31/2014 07:30 AM   HCT 40.8 07/31/2014 07:30 AM    Discharge Diagnoses: Term Pregnancy-delivered  Discharge Information: Date: 08/01/2014 Activity: pelvic rest Diet: routine  Medications: PNV, Ibuprofen and Colace Breast feeding:  Yes Condition: stable Instructions: refer to handout Discharge to: home   Discharge Instructions    Activity as tolerated    Complete by:  As directed      Call MD for:  difficulty  breathing, headache or visual disturbances    Complete by:  As directed      Call MD for:  extreme fatigue    Complete by:  As directed      Call MD for:  hives    Complete by:  As directed      Call MD for:  persistant dizziness or light-headedness    Complete by:  As directed      Call MD for:  persistant nausea and vomiting    Complete by:  As directed      Call MD for:  severe uncontrolled pain    Complete by:  As directed      Call MD for:  temperature >100.4    Complete by:  As directed      Diet - low sodium heart healthy    Complete by:  As directed      Discharge instructions    Complete by:  As directed   Postpartum follow up at the health department in 6 weeks.     Sexual acrtivity    Complete by:  As directed   Pelvic rest x 6 weeks            Medication List    TAKE these medications        docusate sodium 100 MG capsule  Commonly known as:  COLACE  Take 1 capsule (100 mg total) by mouth 2 (two) times daily.     ibuprofen 600 MG tablet  Commonly known as:  ADVIL,MOTRIN  Take 1 tablet (600 mg total) by mouth every 6 (  six) hours.     prenatal multivitamin Tabs tablet  Take 1 tablet by mouth daily.           Follow-up Information    Follow up with [facility] [place] In 6 weeks.   Why:  Post partum check   Contact information:   [address] [place] [address] [phone]      William Dalton ,MD OB Fellow 08/01/2014,6:51 AM

## 2014-08-06 ENCOUNTER — Inpatient Hospital Stay (HOSPITAL_COMMUNITY): Admission: RE | Admit: 2014-08-06 | Payer: Medicaid Other | Source: Ambulatory Visit

## 2015-06-03 DIAGNOSIS — K429 Umbilical hernia without obstruction or gangrene: Secondary | ICD-10-CM

## 2015-06-03 HISTORY — DX: Umbilical hernia without obstruction or gangrene: K42.9

## 2015-08-23 ENCOUNTER — Ambulatory Visit (INDEPENDENT_AMBULATORY_CARE_PROVIDER_SITE_OTHER): Payer: Self-pay | Admitting: Internal Medicine

## 2015-08-23 ENCOUNTER — Encounter: Payer: Self-pay | Admitting: Internal Medicine

## 2015-08-23 VITALS — BP 114/70 | HR 72 | Ht 62.0 in | Wt 154.0 lb

## 2015-08-23 DIAGNOSIS — K029 Dental caries, unspecified: Secondary | ICD-10-CM

## 2015-08-23 MED ORDER — IBUPROFEN 200 MG PO TABS: ORAL_TABLET | ORAL | Status: DC

## 2015-08-23 NOTE — Patient Instructions (Addendum)

## 2015-08-23 NOTE — Progress Notes (Signed)
   Subjective:    Patient ID: Caroline [name], female    DOB: 21-Jul-1985, 30 y.o.   MRN: [id number]  HPI  New Patient to Establish  Has 3 teeth bothering her --a molar on either side and a tooth closer to the front on the left.  Hurts to chew.  No swelling or drainage from her gingiva.  Has noted a bit of redness, however.    Has not taken any medications for this.  Is nursing her 30 yo child still.  Meds:  None   No Known Allergies    Review of Systems     Objective:   Physical Exam  NAD HEENT: PERRL, EOMI, TMs pearly gray, throat without injection, large cavity right mid molar, cavities at lateral base of left mid molar and premolar on left.  Many large fillings and dark discoloration of teeth Neck:  Supple, No adenopathy, no thyromegaly Chest:  CTA CV:  RRR without murmur or rub, radial pulses normal and equal      Assessment & Plan:  Dental decay:  Referral to Dental clinic.  To brush teeth twice daily and floss once daily Ibuprofen for pain with meals twice daily as needed

## 2015-11-29 ENCOUNTER — Ambulatory Visit (INDEPENDENT_AMBULATORY_CARE_PROVIDER_SITE_OTHER): Payer: Self-pay | Admitting: Family Medicine

## 2015-11-29 ENCOUNTER — Encounter: Payer: Self-pay | Admitting: Internal Medicine

## 2015-11-29 VITALS — BP 122/78 | HR 64 | Resp 16 | Ht 62.0 in | Wt 153.0 lb

## 2015-11-29 DIAGNOSIS — K029 Dental caries, unspecified: Secondary | ICD-10-CM

## 2015-11-29 DIAGNOSIS — K0889 Other specified disorders of teeth and supporting structures: Secondary | ICD-10-CM

## 2015-11-29 MED ORDER — AMOXICILLIN 500 MG PO CAPS: 500.0000 mg | ORAL_CAPSULE | Freq: Three times a day (TID) | ORAL | Status: DC

## 2015-11-29 NOTE — Progress Notes (Signed)
CC: Dental pain  HPI: The patient is a 30 year old female, previously healthy who returns for dental pain.  The patient first developed discomfort in her lower molars and upper eye tooth (looks like #12, 18 and 31) several months ago.  She was seen by Dr. Delrae AlfredMulberry and prescribed ibuprofen and referred to a dentist.  The dentist never called, she never took medicine, and her symptoms resolved by themselves.    Now, in the last week, her pain is back.  It is moderate to severe, located "in the gums", and in the same teeth, located on the left side mostly.  There is no swelling, fever, drainage.  No facial pain.  Worse with chewing.      PMH: No known allergies.  No previous illness.  Nursing currently.   Medications: None    OBJECTIVE: BP 122/78 mmHg  Pulse 64  Resp 16  Ht 5\' 2"  (1.575 m)  Wt 153 lb (69.4 kg)  BMI 27.98 kg/m2  Breastfeeding? Yes GEN: Well appearing, adult female, NAD. HEENT: External nose normal, no drainage.  The #12 is darkened, with redness of the gum, no swelling.  The #18 is also with old cavity/filled, no swelling.  No facial swelling.     A&P: 1. Dental pain: -Ibuprofen PRN -Amoxicillin 500 mg TID for 10 days -We have messaged dentist again to expedite referral   Patient Instructions  For pain, take ibuprofen 400 mg (two tablets) up to three times daily.  Avoid taking for longer than 2 weeks (switch to acetaminophen/Tylenol if needed at that point).  Por el dolor, toma ibuprofen/Motrin/Advil 400 mg (dos pastillas) dos o tres veces cada dia.  Evita tomar mas de 2 semanas (usar el Tylenol si se necesita)  Por el infeccion, toma amoxicillin 500 mg (uno pastilla) tres veces cada dia por 10 dias.  Toma todo los pastillas.  [facility] [address]         Earl LitesChristopher P Leslie Langille 11/29/2015, 12:45 PM

## 2015-11-29 NOTE — Patient Instructions (Signed)
For pain, take ibuprofen 400 mg (two tablets) up to three times daily.  Avoid taking for longer than 2 weeks (switch to acetaminophen/Tylenol if needed at that point).  Por el dolor, toma ibuprofen/Motrin/Advil 400 mg (dos pastillas) dos o tres veces cada dia.  Evita tomar mas de 2 semanas (usar el Tylenol si se necesita)  Por el infeccion, toma amoxicillin 500 mg (uno pastilla) tres veces cada dia por 10 dias.  Toma todo los pastillas.  [facility] [address]

## 2016-02-22 ENCOUNTER — Encounter: Payer: Self-pay | Admitting: Internal Medicine

## 2016-02-22 ENCOUNTER — Ambulatory Visit (INDEPENDENT_AMBULATORY_CARE_PROVIDER_SITE_OTHER): Payer: Self-pay | Admitting: Internal Medicine

## 2016-02-22 DIAGNOSIS — K429 Umbilical hernia without obstruction or gangrene: Secondary | ICD-10-CM

## 2016-02-22 NOTE — Progress Notes (Signed)
   Subjective:    Patient ID: Caroline [name], female    DOB: 12/03/1985, 30 y.o.   MRN: [id number]  HPI  Pain in umbilical area.  Only when has pressure against the area.  Has a small protrusion there, does not really get any bigger than what currently is.  No problems with defecation or inability to have BM.  No nausea or vomiting. Noted first time March 2016.   Pain is not really worse with time--about the same States she has a 1 1/2 yo baby and noted this about 3 weeks after NVD of her baby  No outpatient prescriptions have been marked as taking for the 02/22/16 encounter (Office Visit) with Julieanne MansonElizabeth Chella Chapdelaine, MD.   No Known Allergies   Review of Systems     Objective:   Physical Exam  Lungs:  CTA CV:  RRR without murmur or rub Abd:  S, NT, No HSM or mass, fingertip umbilical hernia. Easily reducible.  +BS throughout.        Assessment & Plan:  1.  Small umbilical hernia:  Discussed watchful waiting as not very symptomatic vs. Referral to surgery and cost she currently is able to afford.  [facility] and apply for financial assistance if so chooses. Discussed signs and symptoms of incarceration or strangulation and need to be seen immediately.

## 2016-02-22 NOTE — Patient Instructions (Signed)
habla clinica si tiene mas problema con sus estomago

## 2016-03-07 ENCOUNTER — Encounter: Payer: Self-pay | Admitting: Internal Medicine

## 2016-03-07 ENCOUNTER — Ambulatory Visit (INDEPENDENT_AMBULATORY_CARE_PROVIDER_SITE_OTHER): Payer: Self-pay | Admitting: Internal Medicine

## 2016-03-07 VITALS — BP 100/60 | HR 68 | Temp 97.9°F | Resp 16 | Ht 63.25 in | Wt 150.0 lb

## 2016-03-07 DIAGNOSIS — R1013 Epigastric pain: Secondary | ICD-10-CM

## 2016-03-07 DIAGNOSIS — R112 Nausea with vomiting, unspecified: Secondary | ICD-10-CM

## 2016-03-07 DIAGNOSIS — R748 Abnormal levels of other serum enzymes: Secondary | ICD-10-CM

## 2016-03-07 MED ORDER — METHYLCELLULOSE (LAXATIVE) PO POWD: 1.0000 | Freq: Every day | ORAL | Status: DC

## 2016-03-07 NOTE — Patient Instructions (Signed)
Small amounts of water throughout the day

## 2016-03-07 NOTE — Progress Notes (Signed)
   Subjective:    Patient ID: Caroline [name], female    DOB: June 08, 1985, 30 y.o.   MRN: [id number]  HPI  Poor historian Has been feeling ill for 15 days, basically since the day after last seen here for Umbilical hernia concerns. Started out feeling very cold and felt she would faint, weak. Also, with mid-epigastric pain.  States feels like she has something "stuck"  In the epigastric area.  Does not radiate anywhere. Clarifies that it feels like something is not passing through her digestive system at that level.  Pain would come and go.  Has had the pain only 4 times in the past 15 days.  Pain can last several hours as it did the first day of illness, but last 3 episodes only 1-1 1/2 hours States eating did not make this worse, in fact, has been eating fine. Later, clear she has not been eating as normal.  Does not eat as much.  Does not cause pain to eat as much, just not interested in eating as much. No burning or sense of acid reflux. This morning around 4 a.m., was sweaty for some time, but denies feeling like she felt feverish and looked pale per her friend, developed nausea and then vomited.  Emesis contained food from her dinner the night before.  No diarrhea or constipation.  No melena or hematochezia.  Has had a little bit of bright red blood on tissue after a hard stool.  Has been taking unknown medication that may be aspirin, but has only used twice during the 15 day illness.  She feels she is worsening.   No history of abdominal surgery. No urinary symptoms, no vaginal disharge.  Periods are light and irregular.  Has IUD Mirena. No cough, sore throat, ear pain, congestion.  Current Meds  Medication Sig  . levonorgestrel (MIRENA) 20 MCG/24HR IUD 1 each by Intrauterine route once.  No Known Allergies             Review of Systems     Objective:   Physical Exam  Tearful when discussing discomfort, bu otherwise, NAD.  Does not currently appear in pain HEENT:   PERRL, EOMI, no scleral icterus, TMs pearly gray, throat without injection. Neck:  Supple, no adenopathy Chest:  CTA CV:  RRR without murmur or rub, radial and DP pulses normal and equal Abd:  S, Mild RUQ tenderness with discomfort also in LLQ with palpation of RUQ.  Mild to moderate tenderness in L mid abdomen--not truly in LLQ  No rebound or peritoneal signs.  + BS, No HSM or mass. + BS. Skin:  No rash.        Assessment & Plan:  Abdominal pain with fatigue and weakness:  Difficult and contradictory history.   Not clear if truly with constipation--but with her most  prominent tenderness in Left mid abdomen, will have her start Citrucel daily. CBC, CMP to see if can clarify what may be causing her symptoms. Appears healthy today

## 2016-03-08 ENCOUNTER — Encounter: Payer: Self-pay | Admitting: Internal Medicine

## 2016-03-08 DIAGNOSIS — R748 Abnormal levels of other serum enzymes: Secondary | ICD-10-CM | POA: Insufficient documentation

## 2016-03-08 LAB — COMPREHENSIVE METABOLIC PANEL
ALK PHOS: 101 IU/L (ref 39–117)
ALT: 334 IU/L — ABNORMAL HIGH (ref 0–32)
AST: 168 IU/L — AB (ref 0–40)
Albumin/Globulin Ratio: 1.6 (ref 1.2–2.2)
Albumin: 4.3 g/dL (ref 3.5–5.5)
BUN/Creatinine Ratio: 27 — ABNORMAL HIGH (ref 9–23)
BUN: 15 mg/dL (ref 6–20)
Bilirubin Total: <0.2 mg/dL (ref 0.0–1.2)
CO2: 25 mmol/L (ref 18–29)
CREATININE: 0.56 mg/dL — AB (ref 0.57–1.00)
Calcium: 8.8 mg/dL (ref 8.7–10.2)
Chloride: 102 mmol/L (ref 96–106)
GFR calc Af Amer: 145 mL/min/{1.73_m2} (ref >59)
GFR calc non Af Amer: 126 mL/min/{1.73_m2} (ref >59)
GLUCOSE: 113 mg/dL — AB (ref 65–99)
Globulin, Total: 2.7 g/dL (ref 1.5–4.5)
Potassium: 4.3 mmol/L (ref 3.5–5.2)
SODIUM: 139 mmol/L (ref 134–144)
Total Protein: 7 g/dL (ref 6.0–8.5)

## 2016-03-08 LAB — CBC WITH DIFFERENTIAL/PLATELET
Basophils Absolute: 0 10*3/uL (ref 0.0–0.2)
Basos: 0 %
EOS (ABSOLUTE): 0.1 10*3/uL (ref 0.0–0.4)
EOS: 3 %
HEMATOCRIT: 41.3 % (ref 34.0–46.6)
Hemoglobin: 14 g/dL (ref 11.1–15.9)
IMMATURE GRANULOCYTES: 0 %
Immature Grans (Abs): 0 10*3/uL (ref 0.0–0.1)
Lymphocytes Absolute: 1.5 10*3/uL (ref 0.7–3.1)
Lymphs: 36 %
MCH: 31.7 pg (ref 26.6–33.0)
MCHC: 33.9 g/dL (ref 31.5–35.7)
MCV: 93 fL (ref 79–97)
Monocytes Absolute: 0.3 10*3/uL (ref 0.1–0.9)
Monocytes: 6 %
NEUTROS PCT: 55 %
Neutrophils Absolute: 2.3 10*3/uL (ref 1.4–7.0)
PLATELETS: 181 10*3/uL (ref 150–379)
RBC: 4.42 x10E6/uL (ref 3.77–5.28)
RDW: 13.5 % (ref 12.3–15.4)
WBC: 4.2 10*3/uL (ref 3.4–10.8)

## 2016-03-11 LAB — SPECIMEN STATUS REPORT

## 2016-03-11 LAB — HEPATITIS PANEL, ACUTE
HEP B C IGM: NEGATIVE
Hep A IgM: NEGATIVE
Hep C Virus Ab: <0.1 s/co ratio (ref 0.0–0.9)
Hepatitis B Surface Ag: NEGATIVE

## 2016-03-12 ENCOUNTER — Other Ambulatory Visit: Payer: Self-pay

## 2016-03-13 ENCOUNTER — Other Ambulatory Visit (INDEPENDENT_AMBULATORY_CARE_PROVIDER_SITE_OTHER): Payer: Self-pay

## 2016-03-13 DIAGNOSIS — R748 Abnormal levels of other serum enzymes: Secondary | ICD-10-CM

## 2016-03-14 LAB — HEPATIC FUNCTION PANEL
ALBUMIN: 4 g/dL (ref 3.5–5.5)
ALT: 192 IU/L — AB (ref 0–32)
AST: 33 IU/L (ref 0–40)
Alkaline Phosphatase: 96 IU/L (ref 39–117)
BILIRUBIN TOTAL: 0.2 mg/dL (ref 0.0–1.2)
Bilirubin, Direct: 0.06 mg/dL (ref 0.00–0.40)
Total Protein: 6.2 g/dL (ref 6.0–8.5)

## 2016-04-03 ENCOUNTER — Other Ambulatory Visit: Payer: Self-pay

## 2016-04-10 ENCOUNTER — Ambulatory Visit: Payer: Self-pay | Admitting: Internal Medicine

## 2016-04-10 ENCOUNTER — Ambulatory Visit (INDEPENDENT_AMBULATORY_CARE_PROVIDER_SITE_OTHER): Payer: Self-pay | Admitting: Internal Medicine

## 2016-04-10 ENCOUNTER — Other Ambulatory Visit: Payer: Self-pay | Admitting: Internal Medicine

## 2016-04-10 ENCOUNTER — Encounter: Payer: Self-pay | Admitting: Internal Medicine

## 2016-04-10 VITALS — BP 100/64 | HR 62 | Resp 18 | Ht 63.25 in | Wt 155.5 lb

## 2016-04-10 DIAGNOSIS — Z13220 Encounter for screening for lipoid disorders: Secondary | ICD-10-CM

## 2016-04-10 DIAGNOSIS — R748 Abnormal levels of other serum enzymes: Secondary | ICD-10-CM

## 2016-04-10 DIAGNOSIS — N898 Other specified noninflammatory disorders of vagina: Secondary | ICD-10-CM

## 2016-04-10 DIAGNOSIS — Z124 Encounter for screening for malignant neoplasm of cervix: Secondary | ICD-10-CM

## 2016-04-10 DIAGNOSIS — Z0000 Encounter for general adult medical examination without abnormal findings: Secondary | ICD-10-CM

## 2016-04-10 LAB — POCT WET PREP WITH KOH
KOH Prep POC: NEGATIVE
RBC WET PREP PER HPF POC: NEGATIVE
Trichomonas, UA: NEGATIVE
Yeast Wet Prep HPF POC: NEGATIVE

## 2016-04-10 NOTE — Progress Notes (Signed)
Subjective:    Patient ID: Caroline [name], female    DOB: 1986/01/28, 30 y.o.   MRN: [id number]  HPI  CPE with pap  1.  Pap:  Cannot say she has ever had a pap  2.  Mammogram:  Never  3.  Osteoprevention:  3 portions of dairy daily  4.  Guaiac Cards:  Never  5.  Colonoscopy:  Never  6.  Immunizations:  No influenza this year. Immunization History  Administered Date(s) Administered  . Tdap 11/27/2011   Current Meds  Medication Sig  . levonorgestrel (MIRENA) 20 MCG/24HR IUD 1 each by Intrauterine route once.   No Known Allergies   PMH: Umbilical Hernia  PSH:  None  Family History  Problem Relation Age of Onset  . Hypertension Father     Social History   Social History  . Marital status: Single    Spouse name: Boyfriend:  Van ClinesAndres Herna  . Number of children: 3  . Years of education: N/A   Occupational History  . Homemaker    Social History Main Topics  . Smoking status: Never Smoker  . Smokeless tobacco: Never Used  . Alcohol use No  . Drug use: No  . Sexual activity: Yes    Birth control/ protection: IUD     Comment: IUD placed April 2016   Other Topics Concern  . Not on file   Social History Narrative   Originally [place]   [place]   Lives with boyfriend and 3 children         Review of Systems  Constitutional: Negative for fatigue.  HENT: Negative for dental problem, ear pain and hearing loss.        Dental appt. Yesterday. Had on cavity filled  Eyes: Negative for visual disturbance.  Respiratory: Negative for cough and shortness of breath.   Cardiovascular: Negative for chest pain, palpitations and leg swelling.  Gastrointestinal: Negative for abdominal pain, constipation and diarrhea.       Had elevated liver enzymes last month that were resolving with a second check.  Hepatitis A, B, C evaluation was negative.   Plan for recheck today.  Genitourinary: Negative for dysuria and menstrual problem.    Musculoskeletal: Negative for arthralgias.  Skin: Negative for rash.       No skin lesions with change  Neurological: Negative for weakness, numbness and headaches.  Hematological: Does not bruise/bleed easily.  Psychiatric/Behavioral: Negative for dysphoric mood and suicidal ideas. The patient is not nervous/anxious.        Objective:   Physical Exam  Constitutional: She is oriented to person, place, and time. She appears well-developed and well-nourished.  HENT:  Head: Normocephalic and atraumatic.  Right Ear: Hearing, tympanic membrane, external ear and ear canal normal.  Left Ear: Hearing, tympanic membrane, external ear and ear canal normal.  Nose: Nose normal.  Mouth/Throat: Uvula is midline, oropharynx is clear and moist and mucous membranes are normal.  Eyes: Conjunctivae and EOM are normal. Pupils are equal, round, and reactive to light.  Discs sharp bilaterally  Neck: Normal range of motion. Neck supple. No thyroid mass and no thyromegaly present.  Cardiovascular: Normal rate, regular rhythm, S1 normal and S2 normal.  Exam reveals no S3, no S4 and no friction rub.   No murmur heard. Carotid, radial, femoral, DP and PT pulses normal and equal.   Pulmonary/Chest: Effort normal and breath sounds normal. Right breast exhibits no inverted nipple, no mass, no nipple discharge, no  skin change and no tenderness. Left breast exhibits no inverted nipple, no mass, no nipple discharge, no skin change and no tenderness.  Abdominal: Soft. Bowel sounds are normal. She exhibits no mass. There is no hepatosplenomegaly. There is no tenderness. No hernia.  Genitourinary: Vagina normal and uterus normal. Right adnexum displays no mass and no tenderness. Left adnexum displays no mass and no tenderness.  Genitourinary Comments: Scant thin yellow discharge  Musculoskeletal: Normal range of motion.  Lymphadenopathy:       Head (right side): No submental and no submandibular adenopathy present.        Head (left side): No submental and no submandibular adenopathy present.    She has no cervical adenopathy.    She has no axillary adenopathy.       Right: No inguinal and no supraclavicular adenopathy present.       Left: No inguinal and no supraclavicular adenopathy present.  Neurological: She is alert and oriented to person, place, and time. She has normal strength and normal reflexes. No cranial nerve deficit or sensory deficit. Coordination and gait normal.  Skin: Skin is warm and dry. No lesion and no rash noted.  Psychiatric: She has a normal mood and affect. Her speech is normal and behavior is normal. Judgment and thought content normal.          Assessment & Plan:  1.  CPE with pap To call back regarding flu vaccine nonfasting labs today, but still checking FLPe    2.  BV:  But asymptomatic.  No treatment   4.  Elevated liver enzymes:  Hepatic profile

## 2016-04-11 LAB — HEPATIC FUNCTION PANEL
ALBUMIN: 3.9 g/dL (ref 3.5–5.5)
ALK PHOS: 66 IU/L (ref 39–117)
ALT: 17 IU/L (ref 0–32)
AST: 16 IU/L (ref 0–40)
Bilirubin Total: 0.2 mg/dL (ref 0.0–1.2)
Bilirubin, Direct: 0.08 mg/dL (ref 0.00–0.40)
Total Protein: 6.4 g/dL (ref 6.0–8.5)

## 2016-04-11 LAB — LIPID PANEL W/O CHOL/HDL RATIO
Cholesterol, Total: 126 mg/dL (ref 100–199)
HDL: 39 mg/dL — AB (ref >39)
LDL Calculated: 74 mg/dL (ref 0–99)
TRIGLYCERIDES: 66 mg/dL (ref 0–149)
VLDL Cholesterol Cal: 13 mg/dL (ref 5–40)

## 2016-04-11 LAB — CYTOLOGY - PAP

## 2016-07-13 DIAGNOSIS — K429 Umbilical hernia without obstruction or gangrene: Secondary | ICD-10-CM | POA: Diagnosis not present

## 2016-07-13 DIAGNOSIS — K8010 Calculus of gallbladder with chronic cholecystitis without obstruction: Principal | ICD-10-CM | POA: Insufficient documentation

## 2016-07-13 DIAGNOSIS — K819 Cholecystitis, unspecified: Secondary | ICD-10-CM | POA: Diagnosis present

## 2016-07-14 ENCOUNTER — Encounter (HOSPITAL_COMMUNITY)
Admission: EM | Disposition: A | Discharge status: Home / Self Care | Payer: Self-pay | Source: Home / Self Care | Attending: Emergency Medicine

## 2016-07-14 ENCOUNTER — Observation Stay (HOSPITAL_COMMUNITY): Payer: Medicaid Other | Admitting: Anesthesiology

## 2016-07-14 ENCOUNTER — Encounter (HOSPITAL_COMMUNITY): Payer: Self-pay | Admitting: *Deleted

## 2016-07-14 ENCOUNTER — Emergency Department (HOSPITAL_COMMUNITY): Payer: Medicaid Other

## 2016-07-14 ENCOUNTER — Observation Stay (HOSPITAL_COMMUNITY)
Admission: EM | Admit: 2016-07-14 | Discharge: 2016-07-15 | Disposition: A | Discharge status: Home / Self Care | Payer: Medicaid Other | Attending: Emergency Medicine | Admitting: Emergency Medicine

## 2016-07-14 DIAGNOSIS — K8010 Calculus of gallbladder with chronic cholecystitis without obstruction: Secondary | ICD-10-CM | POA: Diagnosis not present

## 2016-07-14 DIAGNOSIS — K8000 Calculus of gallbladder with acute cholecystitis without obstruction: Secondary | ICD-10-CM | POA: Diagnosis present

## 2016-07-14 DIAGNOSIS — K819 Cholecystitis, unspecified: Secondary | ICD-10-CM

## 2016-07-14 DIAGNOSIS — K429 Umbilical hernia without obstruction or gangrene: Secondary | ICD-10-CM | POA: Diagnosis not present

## 2016-07-14 DIAGNOSIS — R1013 Epigastric pain: Secondary | ICD-10-CM

## 2016-07-14 HISTORY — PX: CHOLECYSTECTOMY: SHX55

## 2016-07-14 LAB — CBC
HCT: 38 % (ref 36.0–46.0)
HEMOGLOBIN: 13.1 g/dL (ref 12.0–15.0)
MCH: 32.2 pg (ref 26.0–34.0)
MCHC: 34.5 g/dL (ref 30.0–36.0)
MCV: 93.4 fL (ref 78.0–100.0)
Platelets: 179 10*3/uL (ref 150–400)
RBC: 4.07 MIL/uL (ref 3.87–5.11)
RDW: 12.5 % (ref 11.5–15.5)
WBC: 4.9 10*3/uL (ref 4.0–10.5)

## 2016-07-14 LAB — COMPREHENSIVE METABOLIC PANEL
ALK PHOS: 60 U/L (ref 38–126)
ALT: 87 U/L — ABNORMAL HIGH (ref 14–54)
AST: 129 U/L — ABNORMAL HIGH (ref 15–41)
Albumin: 3.7 g/dL (ref 3.5–5.0)
Anion gap: 7 (ref 5–15)
BUN: 12 mg/dL (ref 6–20)
CALCIUM: 8.9 mg/dL (ref 8.9–10.3)
CO2: 25 mmol/L (ref 22–32)
Chloride: 107 mmol/L (ref 101–111)
Creatinine, Ser: 0.58 mg/dL (ref 0.44–1.00)
GFR calc Af Amer: >60 mL/min (ref >60)
Glucose, Bld: 113 mg/dL — ABNORMAL HIGH (ref 65–99)
Potassium: 4 mmol/L (ref 3.5–5.1)
SODIUM: 139 mmol/L (ref 135–145)
Total Bilirubin: 0.3 mg/dL (ref 0.3–1.2)
Total Protein: 6 g/dL — ABNORMAL LOW (ref 6.5–8.1)

## 2016-07-14 LAB — URINALYSIS, ROUTINE W REFLEX MICROSCOPIC
Bilirubin Urine: NEGATIVE
Glucose, UA: NEGATIVE mg/dL
HGB URINE DIPSTICK: NEGATIVE
Ketones, ur: NEGATIVE mg/dL
LEUKOCYTES UA: NEGATIVE
NITRITE: NEGATIVE
PROTEIN: NEGATIVE mg/dL
SPECIFIC GRAVITY, URINE: 1.024 (ref 1.005–1.030)
pH: 8 (ref 5.0–8.0)

## 2016-07-14 LAB — LIPASE, BLOOD: Lipase: 23 U/L (ref 11–51)

## 2016-07-14 LAB — SURGICAL PCR SCREEN
MRSA, PCR: NEGATIVE
STAPHYLOCOCCUS AUREUS: NEGATIVE

## 2016-07-14 LAB — POC URINE PREG, ED: Preg Test, Ur: NEGATIVE

## 2016-07-14 SURGERY — LAPAROSCOPIC CHOLECYSTECTOMY
Anesthesia: General | Site: Abdomen

## 2016-07-14 MED ORDER — SODIUM CHLORIDE 0.9 % IR SOLN
Status: DC | PRN
  Administered 2016-07-14: 1000 mL

## 2016-07-14 MED ORDER — NEOSTIGMINE METHYLSULFATE 10 MG/10ML IV SOLN
INTRAVENOUS | Status: DC | PRN
  Administered 2016-07-14: 3 mg via INTRAVENOUS

## 2016-07-14 MED ORDER — OXYCODONE HCL 5 MG PO TABS
5.0000 mg | ORAL_TABLET | ORAL | Status: DC | PRN
  Administered 2016-07-15: 10 mg via ORAL
  Filled 2016-07-14: qty 2

## 2016-07-14 MED ORDER — HYDROMORPHONE HCL 2 MG/ML IJ SOLN
1.0000 mg | INTRAMUSCULAR | Status: DC | PRN
  Administered 2016-07-14: 1 mg via INTRAVENOUS
  Filled 2016-07-14: qty 1

## 2016-07-14 MED ORDER — 0.9 % SODIUM CHLORIDE (POUR BTL) OPTIME
TOPICAL | Status: DC | PRN
  Administered 2016-07-14: 1000 mL

## 2016-07-14 MED ORDER — DIPHENHYDRAMINE HCL 25 MG PO CAPS: 25.0000 mg | ORAL_CAPSULE | Freq: Four times a day (QID) | ORAL | Status: DC | PRN

## 2016-07-14 MED ORDER — MIDAZOLAM HCL 5 MG/5ML IJ SOLN
INTRAMUSCULAR | Status: DC | PRN
  Administered 2016-07-14: 2 mg via INTRAVENOUS

## 2016-07-14 MED ORDER — ONDANSETRON 4 MG PO TBDP: 4.0000 mg | ORAL_TABLET | Freq: Four times a day (QID) | ORAL | Status: DC | PRN

## 2016-07-14 MED ORDER — PANTOPRAZOLE SODIUM 40 MG IV SOLR
40.0000 mg | Freq: Once | INTRAVENOUS | Status: AC
  Administered 2016-07-14: 40 mg via INTRAVENOUS
  Filled 2016-07-14: qty 40

## 2016-07-14 MED ORDER — HYDROMORPHONE HCL 1 MG/ML IJ SOLN
INTRAMUSCULAR | Status: AC
  Administered 2016-07-14: 0.5 mg via INTRAVENOUS
  Filled 2016-07-14: qty 0.5

## 2016-07-14 MED ORDER — DIPHENHYDRAMINE HCL 50 MG/ML IJ SOLN: 25.0000 mg | Freq: Four times a day (QID) | INTRAMUSCULAR | Status: DC | PRN

## 2016-07-14 MED ORDER — SODIUM CHLORIDE 0.9 % IV BOLUS (SEPSIS)
1000.0000 mL | Freq: Once | INTRAVENOUS | Status: AC
  Administered 2016-07-14: 1000 mL via INTRAVENOUS

## 2016-07-14 MED ORDER — PROMETHAZINE HCL 25 MG/ML IJ SOLN: 6.2500 mg | INTRAMUSCULAR | Status: DC | PRN

## 2016-07-14 MED ORDER — DEXAMETHASONE SODIUM PHOSPHATE 10 MG/ML IJ SOLN
INTRAMUSCULAR | Status: AC
  Filled 2016-07-14: qty 1

## 2016-07-14 MED ORDER — ROCURONIUM BROMIDE 100 MG/10ML IV SOLN
INTRAVENOUS | Status: DC | PRN
  Administered 2016-07-14: 50 mg via INTRAVENOUS

## 2016-07-14 MED ORDER — ONDANSETRON HCL 4 MG/2ML IJ SOLN
4.0000 mg | Freq: Four times a day (QID) | INTRAMUSCULAR | Status: DC | PRN
  Administered 2016-07-14 (×2): 4 mg via INTRAVENOUS
  Filled 2016-07-14: qty 2

## 2016-07-14 MED ORDER — PROPOFOL 10 MG/ML IV BOLUS
INTRAVENOUS | Status: DC | PRN
  Administered 2016-07-14: 200 mg via INTRAVENOUS

## 2016-07-14 MED ORDER — DEXTROSE 5 % IV SOLN
2.0000 g | INTRAVENOUS | Status: DC
  Administered 2016-07-15: 2 g via INTRAVENOUS
  Filled 2016-07-14: qty 2

## 2016-07-14 MED ORDER — SODIUM CHLORIDE 0.9 % IV SOLN
INTRAVENOUS | Status: DC | PRN
  Administered 2016-07-14: .01 mL

## 2016-07-14 MED ORDER — GI COCKTAIL ~~LOC~~
30.0000 mL | Freq: Once | ORAL | Status: AC
  Administered 2016-07-14: 30 mL via ORAL
  Filled 2016-07-14: qty 30

## 2016-07-14 MED ORDER — ONDANSETRON HCL 4 MG/2ML IJ SOLN
4.0000 mg | Freq: Once | INTRAMUSCULAR | Status: AC
  Administered 2016-07-14: 4 mg via INTRAVENOUS
  Filled 2016-07-14: qty 2

## 2016-07-14 MED ORDER — ENOXAPARIN SODIUM 40 MG/0.4ML ~~LOC~~ SOLN
40.0000 mg | SUBCUTANEOUS | Status: DC
  Administered 2016-07-15: 40 mg via SUBCUTANEOUS
  Filled 2016-07-14: qty 0.4

## 2016-07-14 MED ORDER — SCOPOLAMINE 1 MG/3DAYS TD PT72
1.0000 | MEDICATED_PATCH | TRANSDERMAL | Status: DC
  Administered 2016-07-14: 1 via TRANSDERMAL

## 2016-07-14 MED ORDER — SCOPOLAMINE 1 MG/3DAYS TD PT72
MEDICATED_PATCH | TRANSDERMAL | Status: AC
  Filled 2016-07-14: qty 1

## 2016-07-14 MED ORDER — DEXAMETHASONE SODIUM PHOSPHATE 10 MG/ML IJ SOLN
INTRAMUSCULAR | Status: DC | PRN
  Administered 2016-07-14: 10 mg via INTRAVENOUS

## 2016-07-14 MED ORDER — FENTANYL CITRATE (PF) 100 MCG/2ML IJ SOLN
INTRAMUSCULAR | Status: DC | PRN
  Administered 2016-07-14: 100 ug via INTRAVENOUS

## 2016-07-14 MED ORDER — GLYCOPYRROLATE 0.2 MG/ML IJ SOLN
INTRAMUSCULAR | Status: DC | PRN
  Administered 2016-07-14: 0.4 mg via INTRAVENOUS

## 2016-07-14 MED ORDER — BUPIVACAINE HCL (PF) 0.25 % IJ SOLN
INTRAMUSCULAR | Status: AC
  Filled 2016-07-14: qty 30

## 2016-07-14 MED ORDER — MIDAZOLAM HCL 2 MG/2ML IJ SOLN
INTRAMUSCULAR | Status: AC
  Filled 2016-07-14: qty 2

## 2016-07-14 MED ORDER — SODIUM CHLORIDE 0.9 % IV SOLN
INTRAVENOUS | Status: DC
  Administered 2016-07-14: 05:00:00 via INTRAVENOUS

## 2016-07-14 MED ORDER — DEXTROSE 5 % IV SOLN
2.0000 g | Freq: Once | INTRAVENOUS | Status: AC
  Administered 2016-07-14: 2 g via INTRAVENOUS
  Filled 2016-07-14: qty 2

## 2016-07-14 MED ORDER — MORPHINE SULFATE (PF) 4 MG/ML IV SOLN
4.0000 mg | Freq: Once | INTRAVENOUS | Status: AC
  Administered 2016-07-14: 4 mg via INTRAVENOUS
  Filled 2016-07-14 (×2): qty 1

## 2016-07-14 MED ORDER — BUPIVACAINE-EPINEPHRINE 0.25% -1:200000 IJ SOLN
INTRAMUSCULAR | Status: DC | PRN
  Administered 2016-07-14: 24 mL

## 2016-07-14 MED ORDER — IOPAMIDOL (ISOVUE-300) INJECTION 61%
INTRAVENOUS | Status: AC
  Filled 2016-07-14: qty 50

## 2016-07-14 MED ORDER — LACTATED RINGERS IV SOLN
INTRAVENOUS | Status: DC
  Administered 2016-07-14: 10:00:00 via INTRAVENOUS

## 2016-07-14 MED ORDER — PROPOFOL 10 MG/ML IV BOLUS
INTRAVENOUS | Status: AC
  Filled 2016-07-14: qty 20

## 2016-07-14 MED ORDER — HYDROMORPHONE HCL 1 MG/ML IJ SOLN
0.2500 mg | INTRAMUSCULAR | Status: DC | PRN
  Administered 2016-07-14: 0.5 mg via INTRAVENOUS

## 2016-07-14 MED ORDER — FENTANYL CITRATE (PF) 100 MCG/2ML IJ SOLN
INTRAMUSCULAR | Status: AC
  Filled 2016-07-14: qty 4

## 2016-07-14 MED ORDER — LIDOCAINE HCL (CARDIAC) 20 MG/ML IV SOLN
INTRAVENOUS | Status: DC | PRN
  Administered 2016-07-14: 100 mg via INTRATRACHEAL

## 2016-07-14 SURGICAL SUPPLY — 42 items
APPLIER CLIP 5 13 M/L LIGAMAX5 (MISCELLANEOUS) ×4
BLADE SURG ROTATE 9660 (MISCELLANEOUS) IMPLANT
CANISTER SUCTION 2500CC (MISCELLANEOUS) ×4 IMPLANT
CHLORAPREP W/TINT 26ML (MISCELLANEOUS) ×4 IMPLANT
CLIP APPLIE 5 13 M/L LIGAMAX5 (MISCELLANEOUS) ×2 IMPLANT
COVER MAYO STAND STRL (DRAPES) ×4 IMPLANT
COVER SURGICAL LIGHT HANDLE (MISCELLANEOUS) ×4 IMPLANT
DERMABOND ADVANCED (GAUZE/BANDAGES/DRESSINGS) ×2
DERMABOND ADVANCED .7 DNX12 (GAUZE/BANDAGES/DRESSINGS) ×2 IMPLANT
DRAPE C-ARM 42X72 X-RAY (DRAPES) ×4 IMPLANT
ELECT REM PT RETURN 9FT ADLT (ELECTROSURGICAL) ×4
ELECTRODE REM PT RTRN 9FT ADLT (ELECTROSURGICAL) ×2 IMPLANT
FILTER SMOKE EVAC LAPAROSHD (FILTER) ×4 IMPLANT
GLOVE BIO SURGEON STRL SZ8 (GLOVE) ×4 IMPLANT
GLOVE BIOGEL PI IND STRL 8 (GLOVE) ×2 IMPLANT
GLOVE BIOGEL PI INDICATOR 8 (GLOVE) ×2
GOWN STRL REUS W/ TWL LRG LVL3 (GOWN DISPOSABLE) ×4 IMPLANT
GOWN STRL REUS W/ TWL XL LVL3 (GOWN DISPOSABLE) ×2 IMPLANT
GOWN STRL REUS W/TWL LRG LVL3 (GOWN DISPOSABLE) ×4
GOWN STRL REUS W/TWL XL LVL3 (GOWN DISPOSABLE) ×2
KIT BASIN OR (CUSTOM PROCEDURE TRAY) ×4 IMPLANT
KIT ROOM TURNOVER OR (KITS) ×4 IMPLANT
L-HOOK LAP DISP 36CM (ELECTROSURGICAL) ×4
LHOOK LAP DISP 36CM (ELECTROSURGICAL) ×2 IMPLANT
NEEDLE 22X1 1/2 (OR ONLY) (NEEDLE) ×4 IMPLANT
NS IRRIG 1000ML POUR BTL (IV SOLUTION) ×4 IMPLANT
PAD ARMBOARD 7.5X6 YLW CONV (MISCELLANEOUS) ×4 IMPLANT
PENCIL BUTTON HOLSTER BLD 10FT (ELECTRODE) ×4 IMPLANT
POUCH RETRIEVAL ECOSAC 10 (ENDOMECHANICALS) ×2 IMPLANT
POUCH RETRIEVAL ECOSAC 10MM (ENDOMECHANICALS) ×2
SCISSORS LAP 5X35 DISP (ENDOMECHANICALS) ×4 IMPLANT
SET CHOLANGIOGRAPH 5 50 .035 (SET/KITS/TRAYS/PACK) ×4 IMPLANT
SET IRRIG TUBING LAPAROSCOPIC (IRRIGATION / IRRIGATOR) ×4 IMPLANT
SLEEVE ENDOPATH XCEL 5M (ENDOMECHANICALS) ×8 IMPLANT
SPECIMEN JAR SMALL (MISCELLANEOUS) ×4 IMPLANT
SUT VIC AB 4-0 PS2 27 (SUTURE) ×4 IMPLANT
TOWEL OR 17X24 6PK STRL BLUE (TOWEL DISPOSABLE) ×4 IMPLANT
TOWEL OR 17X26 10 PK STRL BLUE (TOWEL DISPOSABLE) ×4 IMPLANT
TRAY LAPAROSCOPIC MC (CUSTOM PROCEDURE TRAY) ×4 IMPLANT
TROCAR XCEL BLUNT TIP 100MML (ENDOMECHANICALS) ×4 IMPLANT
TROCAR XCEL NON-BLD 5MMX100MML (ENDOMECHANICALS) ×4 IMPLANT
TUBING INSUFFLATION (TUBING) ×4 IMPLANT

## 2016-07-14 NOTE — ED Notes (Signed)
Pt states she is having upper abd pain with 1 episode of vomiting yesterday, 07/13/16.

## 2016-07-14 NOTE — Op Note (Signed)
07/14/2016  10:58 AM  PATIENT:  Caroline SalmonMaria A [name]  31 y.o. female  PRE-OPERATIVE DIAGNOSIS:  Cholecystitis  POST-OPERATIVE DIAGNOSIS:  Cholecystitis  PROCEDURE:  Procedure(s): LAPAROSCOPIC CHOLECYSTECTOMY  SURGEON:  Surgeon(s): Violeta GelinasBurke Klayton Monie, MD  ASSISTANTS: Magnus IvanSheila Bowman, RNFA   ANESTHESIA:   local and general  EBL:  Total I/O In: 0  Out: 350 [Urine:350]  BLOOD ADMINISTERED:none  DRAINS: none   SPECIMEN:  Excision  DISPOSITION OF SPECIMEN:  PATHOLOGY  COUNTS:  YES  DICTATION: .Dragon Dictation  Findings: Cholecystitis with cholelithiasis  Procedure in detail: Caroline HesselbachMaria is brought for laparoscopic cholecystectomy. She was identified in the preop holding area. She received intravenous antibiotics. Informed consent was obtained. She is brought to the operating room and general endotracheal anesthesia was administered by the anesthesia staff. Her abdomen was prepped and draped in sterile fashion. We did time out procedure.The infraumbilical region was infiltrated with local. Infraumbilical incision was made. Subcutaneous tissues were dissected down revealing the anterior fascia. This was divided sharply along the midline. Peritoneal cavity was entered under direct vision without complication. A 0 Vicryl pursestring was placed around the fascial opening. Hassan trocar was inserted into the abdomen. The abdomen was insufflated with carbon dioxide in standard fashion. Under direct vision a 5 mm epigastric and 5 mm right lateral ports 2 were placed. Local was used at each port site. The gallbladder was noted to be inflamed. The dome was retracted superior medially. The infundibulum was retracted inferior laterally. Dissection began laterally and progressed medially identifying the cystic duct. Dissection continued until a large window was created between the cystic duct, the infundibulum and the liver. There was good anatomy and no clear and was performed. 3 clips were placed  proximally on the cystic duct, one was placed distally and it was divided. Further dissection revealed the cystic artery which was clipped twice proximally and divided distally with cautery. Gallbladder was taken off the liver bed using Bovie cautery achieving excellent hemostasis. It was placed in a bag and removed from the abdomen. It was sent to pathology. The abdomen was copiously irrigated. Liver bed was dry. Clips remain in good position. Irrigation fluid was evacuated and the ports were removed under direct vision. Pneumoperitoneum was released. All 4 wounds were irrigated and the skin of each was closed with running 4-0 Vicryl subcuticular followed by Dermabond. All counts were correct. She tolerated the procedure well without apparent complications and was taken recovery in stable condition.  PATIENT DISPOSITION:  PACU - hemodynamically stable.   Delay start of Pharmacological VTE agent (>24hrs) due to surgical blood loss or risk of bleeding:  no  Violeta GelinasBurke Saksham Akkerman, MD, MPH, FACS Pager: [phone]  2/12/201810:58 AM

## 2016-07-14 NOTE — Progress Notes (Signed)
Informed consent was signed by this RN  And pt. After MD went in and spoke to pt. About surgery. This RN used an interpreter to verify pt.'s understanding of surgery by MD and then had pt. To sign the consent. Pt. Stated no questions/complaints.

## 2016-07-14 NOTE — ED Notes (Signed)
Md notified of pt BP. Medication verified and approved.

## 2016-07-14 NOTE — ED Provider Notes (Signed)
[facility]-EMERGENCY DEPT Provider Note   CSN: [id number] Arrival date & time: 07/13/16  2343     By signing my name below, I, Caroline [name], attest that this documentation has been prepared under the direction and in the presence of Shon Baton, MD . Electronically Signed: Marnette Burgess [name], Scribe. 07/14/2016. 12:51 AM.   History   Chief Complaint Chief Complaint  Patient presents with  . Abdominal Pain    The history is provided by the patient. A language interpreter was used.    HPI Comments:  Caroline [name] is a 31 y.o. female who presents to the Emergency Department complaining of intermittent, gradually worsening, epigastric abdominal pain onset seven months ago. Pt notes this issue has been reoccurring since July 2017 and worsened PTA today. She currently rates her pain as 9/10. She has associated symptoms of nausea and vomiting x1 episode. She notes she takes pills at home (cannot remember the name) which slightly alleviate her pain but did not take any today. She also states consumption of beer, she ingested some prior to the onset of her symptoms today, exacerbates her symptoms.  Denies daily drinking.  Denies worsening of symptoms with food.   Pt is currently seen at a medical clinic in town for Tx but cannot remember her Provider's name. She reports there is no possibility she is currently pregnant. She denies fever, diarrhea and any other associated symptoms.    I have reviewed patient's chart. Patient was seen and evaluated in October of this year. She was started on a fiber supplement. At that time her LFTs were abnormal. No imaging noted.   Past Medical History:  Diagnosis Date  . Umbilical hernia 2017    Patient Active Problem List   Diagnosis Date Noted  . Elevated liver enzymes 03/08/2016  . Umbilical hernia 02/22/2016  . Dental decay 08/23/2015    Past Surgical History:  Procedure Laterality Date  . NO PAST SURGERIES      OB History    Gravida Para Term Preterm AB Living   3 3 3  0 0 3   SAB TAB Ectopic Multiple Live Births   0 0 0 0 3       Home Medications    Prior to Admission medications   Medication Sig Start Date End Date Taking? Authorizing Provider  levonorgestrel (MIRENA) 20 MCG/24HR IUD 1 each by Intrauterine route once.   Yes Historical Provider, MD    Family History Family History  Problem Relation Age of Onset  . Hypertension Father     Social History Social History  Substance Use Topics  . Smoking status: Never Smoker  . Smokeless tobacco: Never Used  . Alcohol use No     Allergies   Patient has no known allergies.   Review of Systems Review of Systems  Constitutional: Negative for fever.  Gastrointestinal: Positive for abdominal pain, nausea and vomiting. Negative for diarrhea.  All other systems reviewed and are negative.    Physical Exam Updated Vital Signs BP 93/56 (BP Location: Right Arm)   Pulse 64   Temp 98.4 F (36.9 C) (Oral)   Resp 16   LMP 07/09/2016   SpO2 100%   Physical Exam  Constitutional: She is oriented to person, place, and time. She appears well-developed and well-nourished. No distress.  HENT:  Head: Normocephalic and atraumatic.  Cardiovascular: Normal rate, regular rhythm and normal heart sounds.   Pulmonary/Chest: Effort normal. No respiratory distress. She has no wheezes.  Abdominal: Soft.  Bowel sounds are normal. She exhibits no mass. There is tenderness. There is no guarding.  Epigastric right upper quadrant tenderness to palpation, no rebound or guarding, small periumbilical hernia, reducible  Neurological: She is alert and oriented to person, place, and time.  Skin: Skin is warm and dry.  Psychiatric: She has a normal mood and affect.  Nursing note and vitals reviewed.    ED Treatments / Results  DIAGNOSTIC STUDIES:  Oxygen Saturation is 98% on RA, normal by my interpretation.    COORDINATION OF CARE:  12:49 AM Discussed treatment  plan with pt at bedside including US A/P and pt agreed to plan.  Labs (all labs ordered are listed, but only abnormal results are displayed) Labs Reviewed  COMPREHENSIVE METABOLIC PANEL - Abnormal; Notable for the following:       Result Value   Glucose, Bld 113 (*)    Total Protein 6.0 (*)    AST 129 (*)    ALT 87 (*)    All other components within normal limits  LIPASE, BLOOD  CBC  URINALYSIS, ROUTINE W REFLEX MICROSCOPIC  POC URINE PREG, ED    EKG  EKG Interpretation  Date/Time:  Sunday July 13 2016 23:45:59 EST Ventricular Rate:  76 PR Interval:  138 QRS Duration: 88 QT Interval:  376 QTC Calculation: 423 R Axis:   34 Text Interpretation:  Normal sinus rhythm with sinus arrhythmia Nonspecific T wave abnormality Abnormal ECG Confirmed by Wilkie AyeHORTON  MD, Kanai Berrios ([id number]) on 07/14/2016 1:09:37 AM       Radiology [place] Abdomen Limited Ruq  Result Date: 07/14/2016 CLINICAL DATA:  Intermittent gradually worsening epigastric pain for 7 months. EXAM: US ABDOMEN LIMITED - RIGHT UPPER QUADRANT COMPARISON:  None. FINDINGS: Gallbladder: Cholelithiasis with multiple stones in the gallbladder. Largest stone measuring about 7 mm diameter. No gallbladder wall thickening or edema. Murphy's sign is positive. Common bile duct: Diameter: 9 mm, increased. No intraductal stones are visualized but distal common bile duct is obscured by bowel gas. Liver: No focal lesion identified. Within normal limits in parenchymal echogenicity. IMPRESSION: Cholelithiasis with positive Murphy's sign. These findings are consistent with acute cholecystitis in the appropriate clinical setting. Mild nonspecific extrahepatic bile duct dilatation. Electronically Signed   By: Burman NievesWilliam  Stevens M.D.   On: 07/14/2016 01:55    Procedures Procedures (including critical care time)  Medications Ordered in ED Medications  cefTRIAXone (ROCEPHIN) 2 g in dextrose 5 % 50 mL IVPB (not administered)  pantoprazole (PROTONIX)  injection 40 mg (40 mg Intravenous Given 07/14/16 0129)  ondansetron (ZOFRAN) injection 4 mg (4 mg Intravenous Given 07/14/16 0129)  gi cocktail (Maalox,Lidocaine,Donnatal) (30 mLs Oral Given 07/14/16 0113)  morphine 4 MG/ML injection 4 mg (4 mg Intravenous Given 07/14/16 0257)  sodium chloride 0.9 % bolus 1,000 mL (1,000 mLs Intravenous New Bag/Given 07/14/16 0257)     Initial Impression / Assessment and Plan / ED Course  I have reviewed the triage vital signs and the nursing notes.  Pertinent labs & imaging results that were available during my care of the patient were reviewed by me and considered in my medical decision making (see chart for details).     Patient presents with recurrent epigastric pain. She is generally a poor historian. Denies association with food. Some worsening with alcohol intake earlier today. She is nontoxic. Vital signs notable for blood pressure of 93/56. Appears baseline in office 100/64. Patient was given fluids and pain medication. Repeat testing obtained. Liver functions improved but still elevated. Right  upper quadrant ultrasound ordered for evaluation of gallbladder. Right upper quadrant ultrasound shows cholelithiasis and positive Murphy sign consistent with cholecystitis. Discussed with Dr. Corliss Skains.  He will place admission orders and patient will be evaluated first in the morning. Patient was given 2 g of Rocephin.  Final Clinical Impressions(s) / ED Diagnoses   Final diagnoses:  Epigastric pain  Cholecystitis    New Prescriptions New Prescriptions   No medications on file   I personally performed the services described in this documentation, which was scribed in my presence. The recorded information has been reviewed and is accurate.     Shon Baton, MD 07/14/16 [phone]

## 2016-07-14 NOTE — Anesthesia Postprocedure Evaluation (Addendum)
Anesthesia Post Note  Patient: Caroline SalmonMaria A [name]  Procedure(s) Performed: Procedure(s) (LRB): LAPAROSCOPIC CHOLECYSTECTOMY (N/A)  Patient location during evaluation: PACU Anesthesia Type: General Level of consciousness: sedated Pain management: pain level controlled Vital Signs Assessment: post-procedure vital signs reviewed and stable Respiratory status: spontaneous breathing and respiratory function stable Cardiovascular status: stable Anesthetic complications: no       Last Vitals:  Vitals:   07/14/16 1225 07/14/16 1240  BP:  104/61  Pulse:  (!) 56  Resp:  11  Temp: 36.5 C     Last Pain:  Vitals:   07/14/16 1225  TempSrc:   PainSc: Asleep                 Shakia Sebastiano DANIEL

## 2016-07-14 NOTE — Transfer of Care (Signed)
Immediate Anesthesia Transfer of Care Note  Patient: Caroline SalmonMaria A [name]  Procedure(s) Performed: Procedure(s): LAPAROSCOPIC CHOLECYSTECTOMY (N/A)  Patient Location: PACU  Anesthesia Type:General  Level of Consciousness: awake, alert , oriented and patient cooperative  Airway & Oxygen Therapy: Patient Spontanous Breathing and Patient connected to nasal cannula oxygen  Post-op Assessment: Report given to RN and Post -op Vital signs reviewed and stable  Post vital signs: Reviewed and stable  Last Vitals:  Vitals:   07/14/16 0347 07/14/16 0420  BP: 96/57 (!) 97/51  Pulse: 65 60  Resp: 16 17  Temp:  36.6 C    Last Pain:  Vitals:   07/14/16 0737  TempSrc:   PainSc: 0-No pain         Complications: No apparent anesthesia complications

## 2016-07-14 NOTE — Progress Notes (Signed)
Report called to Short Stay. No questions/complaints. 

## 2016-07-14 NOTE — H&P (Signed)
Caroline [name] is an 31 y.o. female.   Chief Complaint: Epigastric pain HPI: 31 yo female presents with several months of intermittent, gradually worsening episodes of epigastric abdominal pain.  She had an acute episode of epigastric pain earlier today after drinking some beer.  She also had some nausea and vomiting.  She denies fever, diarrhea.  She came to the ED for evaluation and her AST/ALT were elevated, but Tbili normal.  US showed gallstones with a positive sonographic Murpy's sign.  Past Medical History:  Diagnosis Date  . Umbilical hernia 1027    Past Surgical History:  Procedure Laterality Date  . NO PAST SURGERIES      Family History  Problem Relation Age of Onset  . Hypertension Father    Social History:  reports that she has never smoked. She has never used smokeless tobacco. She reports that she does not drink alcohol or use drugs.  Allergies: No Known Allergies  Prior to Admission medications   Medication Sig Start Date End Date Taking? Authorizing Provider  levonorgestrel (MIRENA) 20 MCG/24HR IUD 1 each by Intrauterine route once.   Yes Historical Provider, MD     Results for orders placed or performed during the hospital encounter of 07/14/16 (from the past 48 hour(s))  Lipase, blood     Status: None   Collection Time: 07/14/16 12:08 AM  Result Value Ref Range   Lipase 23 11 - 51 U/L  Comprehensive metabolic panel     Status: Abnormal   Collection Time: 07/14/16 12:08 AM  Result Value Ref Range   Sodium 139 135 - 145 mmol/L   Potassium 4.0 3.5 - 5.1 mmol/L   Chloride 107 101 - 111 mmol/L   CO2 25 22 - 32 mmol/L   Glucose, Bld 113 (H) 65 - 99 mg/dL   BUN 12 6 - 20 mg/dL   Creatinine, Ser 0.58 0.44 - 1.00 mg/dL   Calcium 8.9 8.9 - 10.3 mg/dL   Total Protein 6.0 (L) 6.5 - 8.1 g/dL   Albumin 3.7 3.5 - 5.0 g/dL   AST 129 (H) 15 - 41 U/L   ALT 87 (H) 14 - 54 U/L   Alkaline Phosphatase 60 38 - 126 U/L   Total Bilirubin 0.3 0.3 - 1.2 mg/dL   GFR  calc non Af Amer >60 >60 mL/min   GFR calc Af Amer >60 >60 mL/min    Comment: (NOTE) The eGFR has been calculated using the CKD EPI equation. This calculation has not been validated in all clinical situations. eGFR's persistently <60 mL/min signify possible Chronic Kidney Disease.    Anion gap 7 5 - 15  CBC     Status: None   Collection Time: 07/14/16 12:08 AM  Result Value Ref Range   WBC 4.9 4.0 - 10.5 K/uL   RBC 4.07 3.87 - 5.11 MIL/uL   Hemoglobin 13.1 12.0 - 15.0 g/dL   HCT 38.0 36.0 - 46.0 %   MCV 93.4 78.0 - 100.0 fL   MCH 32.2 26.0 - 34.0 pg   MCHC 34.5 30.0 - 36.0 g/dL   RDW 12.5 11.5 - 15.5 %   Platelets 179 150 - 400 K/uL  Urinalysis, Routine w reflex microscopic     Status: None   Collection Time: 07/14/16 12:08 AM  Result Value Ref Range   Color, Urine YELLOW YELLOW   APPearance CLEAR CLEAR   Specific Gravity, Urine 1.024 1.005 - 1.030   pH 8.0 5.0 - 8.0   Glucose, UA  NEGATIVE NEGATIVE mg/dL   Hgb urine dipstick NEGATIVE NEGATIVE   Bilirubin Urine NEGATIVE NEGATIVE   Ketones, ur NEGATIVE NEGATIVE mg/dL   Protein, ur NEGATIVE NEGATIVE mg/dL   Nitrite NEGATIVE NEGATIVE   Leukocytes, UA NEGATIVE NEGATIVE  POC urine preg, ED     Status: None   Collection Time: 07/14/16 12:36 AM  Result Value Ref Range   Preg Test, Ur NEGATIVE NEGATIVE    Comment:        THE SENSITIVITY OF THIS METHODOLOGY IS >24 mIU/mL    US Abdomen Limited Ruq  Result Date: 07/14/2016 CLINICAL DATA:  Intermittent gradually worsening epigastric pain for 7 months. EXAM: US ABDOMEN LIMITED - RIGHT UPPER QUADRANT COMPARISON:  None. FINDINGS: Gallbladder: Cholelithiasis with multiple stones in the gallbladder. Largest stone measuring about 7 mm diameter. No gallbladder wall thickening or edema. Murphy's sign is positive. Common bile duct: Diameter: 9 mm, increased. No intraductal stones are visualized but distal common bile duct is obscured by bowel gas. Liver: No focal lesion identified. Within  normal limits in parenchymal echogenicity. IMPRESSION: Cholelithiasis with positive Murphy's sign. These findings are consistent with acute cholecystitis in the appropriate clinical setting. Mild nonspecific extrahepatic bile duct dilatation. Electronically Signed   By: Lucienne Capers M.D.   On: 07/14/2016 01:55    Review of Systems  Constitutional: Negative for weight loss.  HENT: Negative for ear discharge, ear pain, hearing loss and tinnitus.   Eyes: Negative for blurred vision, double vision, photophobia and pain.  Respiratory: Negative for cough, sputum production and shortness of breath.   Cardiovascular: Negative for chest pain.  Gastrointestinal: Positive for abdominal pain, nausea and vomiting.  Genitourinary: Negative for dysuria, flank pain, frequency and urgency.  Musculoskeletal: Negative for back pain, falls, joint pain, myalgias and neck pain.  Neurological: Negative for dizziness, tingling, sensory change, focal weakness, loss of consciousness and headaches.  Endo/Heme/Allergies: Does not bruise/bleed easily.  Psychiatric/Behavioral: Negative for depression, memory loss and substance abuse. The patient is not nervous/anxious.     Blood pressure 93/56, pulse 64, temperature 98.4 F (36.9 C), temperature source Oral, resp. rate 16, last menstrual period 07/09/2016, SpO2 100 %, currently breastfeeding. Physical Exam  Constitutional: She is oriented to person, place, and time. She appears well-developed and well-nourished. No distress.  HENT:  Head: Normocephalic.  Right Ear: External ear normal.  Left Ear: External ear normal.  Nose: Nose normal.  Mouth/Throat: Oropharynx is clear and moist.  Eyes: EOM are normal. Pupils are equal, round, and reactive to light.  Neck: No thyromegaly present.  Cardiovascular: Normal rate, normal heart sounds and intact distal pulses.   Respiratory: Effort normal and breath sounds normal. No stridor. No respiratory distress. She has no  wheezes. She has no rales.  GI: Soft. She exhibits no distension. There is tenderness. There is no rebound and no guarding.  Minimal RUQ tenderness  Musculoskeletal: Normal range of motion. She exhibits no edema.  Neurological: She is alert and oriented to person, place, and time. She exhibits normal muscle tone.  Skin: Skin is warm and dry.  Psychiatric: She has a normal mood and affect.     Assessment/Plan Early acute cholecystitis.    Admit for IV antibiotics/ hydration.  Will plan laparoscopic cholecystectomy this admission.  Will discuss with the patient via interpreter in the morning.   Imogene Burn. Georgette Dover, MD, [facility]  General/ Trauma Surgery  07/14/2016 3:13 AM   Maia Petties., MD 07/14/2016, 3:08 AM  I have reviewed her  history, labs, U/S,  and examined her as documented above. For laparoscopic cholecystectomy and cholangiogram today. I discussed the procedure, risks, benefits, and expected post-op course with her and I answered her questions. She agrees.  Georganna Skeans, MD, MPH, FACS Trauma: [phone] General Surgery: [phone]

## 2016-07-14 NOTE — Anesthesia Preprocedure Evaluation (Signed)
Anesthesia Evaluation  Patient identified by MRN, date of birth, ID band Patient awake    Reviewed: Allergy & Precautions, NPO status , Patient's Chart, lab work & pertinent test results  History of Anesthesia Complications Negative for: history of anesthetic complications  Airway Mallampati: II  TM Distance: >3 FB Neck ROM: Full    Dental no notable dental hx. (+) Dental Advisory Given   Pulmonary neg pulmonary ROS,    Pulmonary exam normal        Cardiovascular negative cardio ROS Normal cardiovascular exam     Neuro/Psych negative neurological ROS  negative psych ROS   GI/Hepatic Neg liver ROS,   Endo/Other  negative endocrine ROS  Renal/GU negative Renal ROS  negative genitourinary   Musculoskeletal negative musculoskeletal ROS (+)   Abdominal   Peds negative pediatric ROS (+)  Hematology negative hematology ROS (+)   Anesthesia Other Findings   Reproductive/Obstetrics negative OB ROS                             Anesthesia Physical Anesthesia Plan  ASA: II  Anesthesia Plan:    Post-op Pain Management:    Induction: Intravenous  Airway Management Planned:   Additional Equipment:   Intra-op Plan:   Post-operative Plan: Extubation in OR  Informed Consent: I have reviewed the patients History and Physical, chart, labs and discussed the procedure including the risks, benefits and alternatives for the proposed anesthesia with the patient or authorized representative who has indicated his/her understanding and acceptance.   Dental advisory given  Plan Discussed with: CRNA, Anesthesiologist and Surgeon  Anesthesia Plan Comments:         Anesthesia Quick Evaluation

## 2016-07-14 NOTE — Anesthesia Procedure Notes (Signed)
Procedure Name: Intubation Date/Time: 07/14/2016 10:10 AM Performed by: Marena ChancyBECKNER, Chirstine Defrain S Pre-anesthesia Checklist: Patient identified, Emergency Drugs available, Suction available and Patient being monitored Patient Re-evaluated:Patient Re-evaluated prior to inductionOxygen Delivery Method: Circle System Utilized Preoxygenation: Pre-oxygenation with 100% oxygen Intubation Type: IV induction Ventilation: Mask ventilation without difficulty Laryngoscope Size: Miller and 2 Grade View: Grade II Tube type: Oral Tube size: 7.0 mm Number of attempts: 1 Airway Equipment and Method: Stylet and Oral airway Placement Confirmation: ETT inserted through vocal cords under direct vision,  positive ETCO2 and breath sounds checked- equal and bilateral Tube secured with: Tape Dental Injury: Teeth and Oropharynx as per pre-operative assessment

## 2016-07-14 NOTE — Progress Notes (Addendum)
This RN used interpreter to explain MRSA screening before surgery. Pt. Stated no questions nor complaints.

## 2016-07-14 NOTE — ED Triage Notes (Signed)
Pt is here for recurrent epigastric/mid abdominal pain which she has had intermittently since June or July. She has had some nausea and vomting with this at times but not today.  She has had some dizziness with this also at times.  Pt states that she has taken pills (does not know the type) and they help for about an hour.  Last BM today and normal and last period was last week.  Pt denies any GU symptoms with this.

## 2016-07-14 NOTE — Progress Notes (Signed)
Pt. Having some nausea. Nausea medication given. Did not advance diet yet. Will continue to monitor.

## 2016-07-15 ENCOUNTER — Encounter (HOSPITAL_COMMUNITY): Payer: Self-pay | Admitting: *Deleted

## 2016-07-15 ENCOUNTER — Encounter (HOSPITAL_COMMUNITY): Payer: Self-pay | Admitting: General Surgery

## 2016-07-15 LAB — COMPREHENSIVE METABOLIC PANEL
ALK PHOS: 54 U/L (ref 38–126)
ALT: 190 U/L — AB (ref 14–54)
AST: 86 U/L — ABNORMAL HIGH (ref 15–41)
Albumin: 3.2 g/dL — ABNORMAL LOW (ref 3.5–5.0)
Anion gap: 10 (ref 5–15)
BUN: 5 mg/dL — ABNORMAL LOW (ref 6–20)
CALCIUM: 8.6 mg/dL — AB (ref 8.9–10.3)
CHLORIDE: 103 mmol/L (ref 101–111)
CO2: 26 mmol/L (ref 22–32)
CREATININE: 0.6 mg/dL (ref 0.44–1.00)
Glucose, Bld: 106 mg/dL — ABNORMAL HIGH (ref 65–99)
Potassium: 3.8 mmol/L (ref 3.5–5.1)
Sodium: 139 mmol/L (ref 135–145)
TOTAL PROTEIN: 5.3 g/dL — AB (ref 6.5–8.1)
Total Bilirubin: 0.7 mg/dL (ref 0.3–1.2)

## 2016-07-15 LAB — CBC
HCT: 38.4 % (ref 36.0–46.0)
Hemoglobin: 12.4 g/dL (ref 12.0–15.0)
MCH: 30.6 pg (ref 26.0–34.0)
MCHC: 32.3 g/dL (ref 30.0–36.0)
MCV: 94.8 fL (ref 78.0–100.0)
PLATELETS: 160 10*3/uL (ref 150–400)
RBC: 4.05 MIL/uL (ref 3.87–5.11)
RDW: 12.4 % (ref 11.5–15.5)
WBC: 9.8 10*3/uL (ref 4.0–10.5)

## 2016-07-15 MED ORDER — ONDANSETRON 4 MG PO TBDP: 4.0000 mg | ORAL_TABLET | Freq: Four times a day (QID) | ORAL | 0 refills | Status: DC | PRN

## 2016-07-15 MED ORDER — OXYCODONE HCL 5 MG PO TABS: 5.0000 mg | ORAL_TABLET | ORAL | 0 refills | Status: DC | PRN

## 2016-07-15 NOTE — Discharge Summary (Signed)
  [facility] Discharge Summary   Patient ID: Caroline [name] MRN: [id number] DOB/AGE: [birth year] 31 y.o.  Admit date: 07/14/2016 Discharge date: 07/15/2016  Admitting Diagnosis: Cholecystitis  Discharge Diagnosis Patient Active Problem List   Diagnosis Date Noted  . Acute cholecystitis due to biliary calculus 07/14/2016  . Elevated liver enzymes 03/08/2016  . Umbilical hernia 02/22/2016  . Dental decay 08/23/2015    Consultants None  Imaging: Kore[place] Abdomen Limited Ruq  Result Date: 07/14/2016 CLINICAL DATA:  Intermittent gradually worsening epigastric pain for 7 months. EXAM: US ABDOMEN LIMITED - RIGHT UPPER QUADRANT COMPARISON:  None. FINDINGS: Gallbladder: Cholelithiasis with multiple stones in the gallbladder. Largest stone measuring about 7 mm diameter. No gallbladder wall thickening or edema. Murphy's sign is positive. Common bile duct: Diameter: 9 mm, increased. No intraductal stones are visualized but distal common bile duct is obscured by bowel gas. Liver: No focal lesion identified. Within normal limits in parenchymal echogenicity. IMPRESSION: Cholelithiasis with positive Murphy's sign. These findings are consistent with acute cholecystitis in the appropriate clinical setting. Mild nonspecific extrahepatic bile duct dilatation. Electronically Signed   By: Burman NievesWilliam  Stevens M.D.   On: 07/14/2016 01:55    Procedures Dr. Janee Mornhompson (07/14/16) - Laparoscopic Cholecystectomy  Hospital Course:  Caroline [name] is a 31yo female [facility] with several months of intermittent abdominal pain.  Workup showed elevated AST/ALT, normal bilirubin, and u/s with gallstones and positive murphy sign.  Patient was admitted and underwent procedure listed above.  Tolerated procedure well and was transferred to the floor.  Diet was advanced as tolerated.  On POD1, the patient was voiding well, tolerating diet, ambulating well, pain well controlled, vital  signs stable, incisions c/d/i and felt stable for discharge home.  Patient will follow up in our office in 3 weeks and knows to call with questions or concerns.   Physical Exam: General:  Alert, NAD, pleasant, comfortable Pulm: effort normal Abd:  Soft, ND, nontender, incisions C/D/I   Allergies as of 07/15/2016   No Known Allergies     Medication List    TAKE these medications   levonorgestrel 20 MCG/24HR IUD Commonly known as:  MIRENA 1 each by Intrauterine route once.   ondansetron 4 MG disintegrating tablet Commonly known as:  ZOFRAN-ODT Take 1 tablet (4 mg total) by mouth every 6 (six) hours as needed for nausea.   oxyCODONE 5 MG immediate release tablet Commonly known as:  Oxy IR/ROXICODONE Take 1-2 tablets (5-10 mg total) by mouth every 4 (four) hours as needed for moderate pain or severe pain.        Follow-up Information    [facility] [place] on [facility]/[place].   Specialty:  General Surgery Why:  Your appointment is 08/05/2016. Please arrive at 10:15AM to check in and fill out necessary paperwork. Contact information: [address] [place] [phone]          Signed: Edson SnowballBROOKE A MILLER, [facility] 07/15/2016, 9:08 AM Pager: [phone] Consults: [phone] Mon-Fri 7:00 am-4:30 pm Sat-Sun 7:00 am-11:30 am

## 2016-07-15 NOTE — Progress Notes (Signed)
Interpreter Graciela Namihira for Patient °

## 2016-07-15 NOTE — Discharge Instructions (Signed)
CIRUGIA LAPAROSCOPICA: INSTRUCCIONES DE POST OPERATORIO. ° °Revise siempre los documentos que le entreguen en el lugar donde se ha hecho la cirugia. ° °SI USTED NECESITA DOCUMENTOS DE INCAPACIDAD (DISABLE) O DE PERMISO FAMILAR (FAMILY LEAVE) NECESITA TRAERLOS A LA OFICINA PARA QUE SEAN PROCESADOS. °NO  SE LOS DE A SU DOCTOR. °1. A su alta del hospital se le dara una receta para controlar el dolor. Tomela como ha sido recetada, si la necesita. Si no la necesita puede tomar, Acetaminofen (Tylenol) o Ibuprofen (Advil) para aliviar dolor moderado. °2. Continue tomando el resto de sus medicinas. °3. Si necesita rellenar la receta, llame a la farmacia. ellos contactan a nuestra oficina pidiendo autorizacion. Este tipo de receta no pueden ser rellenadas despues de las  5pm o durante los fines de semana. °4. Con relacion a la dieta: debe ser ligera los primeros dias despues que llege a la casa. Ejemplo: sopas y galleticas. Tome bastante liquido esos dias. °5. La mayoria de los pacientes padecen de inflamacion y cambio de coloracion de la piel alrededor de las incisiones. esto toma dias en resolver.  pnerse una bolsa de hielo en el area affectada ayuda..  °6. Es comun tambien tener un poco de estrenimiento si esta tomado medicinas para el dolor. incremente la cantidad de liquidos a tomar y puede tomar (Colace) esto previene el problema. Si ya tiene estrenimiento, es decir no ha defecado en 48 horas, puede tomar un laxativo (Milk of Magnesia or Miralax) uselo como el paquete le explica. °7.  A menos que se le diga algo diferente. Remueva el bendaje a las 24-48 horas despues dela cirugia. y puede banarse en la ducha sin ningun problema. usted puede tener steri-strips (pequenas curitas transparentes en la piel puesta encima de la incision)  Estas banditas strips should be left on the skin for 7-10 days.   Si su cirujano puso pegamento encima de la incision usted puede banarse bajo la ducha en 24 horas. Este pegamento empezara a  caerse en las proximas 2-3 semanas. Si le pusieron suturas o presillas (grapos) estos seran quitados en su proxima cita en la oficina. . °a. ACTIVIDADES:  Puede hacer actividad ligera.  Como caminar , subir escaleras y poco a poco irlas incrementando tanto como las tolere. Puede tener relaciones sexuales cuando sea comfortable. No carge objetos pesados o haga esfuerzos que no sean aprovados por su doctor. °b. Puede manejar en cuanto no esta tomando medicamentos fuertes (narcoticos) para el dolor, pueda abrochar confortablemente el cinturon de seguridad, y pueda maniobrar y usar los pedales de su vehiculo con seguridad. °c. PUEDE REGRESAR A TRABAJAR  °8. Debe ver a su doctor para una cita de seguimiento en 2-3 semanas despues de la cirugia.  °9. OTRAS ISNSTRUCCIONES:___________________________________________________________________________________ °CUANDO LLAMAR A SU MEDICO: °1. FIEBRE mayor de  101.0 °2. No produccion de orina. °3. Sangramiento continue de la herida °4. Incremento de dolor, enrojecimientio o drenaje de la herida (incision) °5. Incremento de dolor abdominal. ° °The clinic staff is available to answer your questions during regular business hours.  Please don’t hesitate to call and ask to speak to one of the nurses for clinical concerns.  If you have a medical emergency, go to the nearest emergency room or call 911.  A surgeon from [facility] is always on call at the hospital. °[address], [address], [place], [place]  [address] ? [address] [address], [place], [place]   [address] °[phone] ? [phone] ? FAX [phone] °Web site: [email or web address] ° ° °

## 2016-07-15 NOTE — Progress Notes (Signed)
IV removed. Patient discharged using Graciela, interpretor. Patient is ready for discharge.

## 2016-07-19 ENCOUNTER — Encounter (HOSPITAL_COMMUNITY): Payer: Self-pay | Admitting: Emergency Medicine

## 2016-07-19 ENCOUNTER — Emergency Department (HOSPITAL_COMMUNITY)
Admission: EM | Admit: 2016-07-19 | Discharge: 2016-07-20 | Disposition: A | Discharge status: Home / Self Care | Payer: Self-pay | Attending: Emergency Medicine | Admitting: Emergency Medicine

## 2016-07-19 DIAGNOSIS — Z79899 Other long term (current) drug therapy: Secondary | ICD-10-CM | POA: Insufficient documentation

## 2016-07-19 DIAGNOSIS — R1013 Epigastric pain: Secondary | ICD-10-CM | POA: Insufficient documentation

## 2016-07-19 DIAGNOSIS — R7401 Elevation of levels of liver transaminase levels: Secondary | ICD-10-CM

## 2016-07-19 DIAGNOSIS — H81399 Other peripheral vertigo, unspecified ear: Secondary | ICD-10-CM | POA: Insufficient documentation

## 2016-07-19 DIAGNOSIS — R1010 Upper abdominal pain, unspecified: Secondary | ICD-10-CM

## 2016-07-19 DIAGNOSIS — R740 Nonspecific elevation of levels of transaminase and lactic acid dehydrogenase [LDH]: Secondary | ICD-10-CM | POA: Insufficient documentation

## 2016-07-19 MED ORDER — ONDANSETRON HCL 4 MG/2ML IJ SOLN
4.0000 mg | Freq: Once | INTRAMUSCULAR | Status: AC
  Administered 2016-07-20: 4 mg via INTRAVENOUS
  Filled 2016-07-19: qty 2

## 2016-07-19 MED ORDER — SODIUM CHLORIDE 0.9 % IV BOLUS (SEPSIS)
1000.0000 mL | Freq: Once | INTRAVENOUS | Status: AC
Start: 2016-07-20 — End: 2016-07-20
  Administered 2016-07-20: 1000 mL via INTRAVENOUS

## 2016-07-19 NOTE — ED Triage Notes (Addendum)
Patient states she is having a strong pain in the epigastric area, she states that she is feeling "heat" in the area.  She states that it hurts after eating.  She had her gall bladder removed on Monday and she is still having the same type of pain that she had before surgery.  She denies any nausea or vomiting.  No diarrhea.  She vomited large amount of emesis in triage upon arrival.  900ml of emesis.  No blood.  Looked like undigested food.

## 2016-07-20 ENCOUNTER — Emergency Department (HOSPITAL_COMMUNITY): Payer: Self-pay

## 2016-07-20 LAB — DIFFERENTIAL
BASOS ABS: 0 10*3/uL (ref 0.0–0.1)
Basophils Relative: 0 %
Eosinophils Absolute: 0.1 10*3/uL (ref 0.0–0.7)
Eosinophils Relative: 1 %
LYMPHS PCT: 26 %
Lymphs Abs: 2.4 10*3/uL (ref 0.7–4.0)
Monocytes Absolute: 0.5 10*3/uL (ref 0.1–1.0)
Monocytes Relative: 6 %
NEUTROS ABS: 6.2 10*3/uL (ref 1.7–7.7)
NEUTROS PCT: 67 %

## 2016-07-20 LAB — URINALYSIS, ROUTINE W REFLEX MICROSCOPIC
Bilirubin Urine: NEGATIVE
Glucose, UA: NEGATIVE mg/dL
Hgb urine dipstick: NEGATIVE
Ketones, ur: NEGATIVE mg/dL
Leukocytes, UA: NEGATIVE
Nitrite: NEGATIVE
PROTEIN: 100 mg/dL — AB
Specific Gravity, Urine: 1.026 (ref 1.005–1.030)
WBC, UA: NONE SEEN WBC/hpf (ref 0–5)
pH: 8 (ref 5.0–8.0)

## 2016-07-20 LAB — COMPREHENSIVE METABOLIC PANEL
ALT: 369 U/L — AB (ref 14–54)
AST: 528 U/L — ABNORMAL HIGH (ref 15–41)
Albumin: 3.9 g/dL (ref 3.5–5.0)
Alkaline Phosphatase: 99 U/L (ref 38–126)
Anion gap: 14 (ref 5–15)
BILIRUBIN TOTAL: 1 mg/dL (ref 0.3–1.2)
BUN: 11 mg/dL (ref 6–20)
CHLORIDE: 101 mmol/L (ref 101–111)
CO2: 23 mmol/L (ref 22–32)
CREATININE: 0.73 mg/dL (ref 0.44–1.00)
Calcium: 9.5 mg/dL (ref 8.9–10.3)
Glucose, Bld: 147 mg/dL — ABNORMAL HIGH (ref 65–99)
POTASSIUM: 3.8 mmol/L (ref 3.5–5.1)
Sodium: 138 mmol/L (ref 135–145)
TOTAL PROTEIN: 6.5 g/dL (ref 6.5–8.1)

## 2016-07-20 LAB — CBC
HEMATOCRIT: 42.8 % (ref 36.0–46.0)
HEMOGLOBIN: 14.5 g/dL (ref 12.0–15.0)
MCH: 31.2 pg (ref 26.0–34.0)
MCHC: 33.9 g/dL (ref 30.0–36.0)
MCV: 92 fL (ref 78.0–100.0)
Platelets: 214 10*3/uL (ref 150–400)
RBC: 4.65 MIL/uL (ref 3.87–5.11)
RDW: 12.2 % (ref 11.5–15.5)
WBC: 9.4 10*3/uL (ref 4.0–10.5)

## 2016-07-20 LAB — LIPASE, BLOOD: LIPASE: 24 U/L (ref 11–51)

## 2016-07-20 MED ORDER — MECLIZINE HCL 25 MG PO TABS: 25.0000 mg | ORAL_TABLET | Freq: Three times a day (TID) | ORAL | 0 refills | Status: DC | PRN

## 2016-07-20 MED ORDER — MORPHINE SULFATE (PF) 4 MG/ML IV SOLN
4.0000 mg | Freq: Once | INTRAVENOUS | Status: AC
  Administered 2016-07-20: 4 mg via INTRAVENOUS
  Filled 2016-07-20: qty 1

## 2016-07-20 MED ORDER — ONDANSETRON HCL 4 MG/2ML IJ SOLN: 4.0000 mg | Freq: Once | INTRAMUSCULAR | Status: DC

## 2016-07-20 MED ORDER — SODIUM CHLORIDE 0.9 % IV BOLUS (SEPSIS)
1000.0000 mL | Freq: Once | INTRAVENOUS | Status: DC
Start: 2016-07-20 — End: 2016-07-20

## 2016-07-20 MED ORDER — SODIUM CHLORIDE 0.9 % IV BOLUS (SEPSIS)
1000.0000 mL | Freq: Once | INTRAVENOUS | Status: AC
  Administered 2016-07-20: 1000 mL via INTRAVENOUS

## 2016-07-20 MED ORDER — MECLIZINE HCL 25 MG PO TABS
25.0000 mg | ORAL_TABLET | Freq: Once | ORAL | Status: AC
  Administered 2016-07-20: 25 mg via ORAL
  Filled 2016-07-20: qty 1

## 2016-07-20 MED ORDER — ONDANSETRON HCL 4 MG PO TABS: 4.0000 mg | ORAL_TABLET | Freq: Four times a day (QID) | ORAL | 0 refills | Status: DC | PRN

## 2016-07-20 NOTE — ED Provider Notes (Signed)
[facility]-EMERGENCY DEPT Provider Note   CSN: [id number] Arrival date & time: 07/19/16  2329    By signing my name below, I, Caroline [name], attest that this documentation has been prepared under the direction and in the presence of Caroline Booze, MD. Electronically Signed: Valentino [name], ED Scribe. 07/20/16. 12:06 AM.  History   Chief Complaint Chief Complaint  Patient presents with  . Abdominal Pain   The history is provided by the patient. No language interpreter was used.   HPI Comments: Caroline [name] is a 31 y.o. female with PMHx of unbilicial hernia who presents to the Emergency Department complaining of moderate, constant, sharp upper epigastric abdominal pain that occurred four hours ago. Per pt, she reports having her gallbladder removed on 02/12. She states her pain today is similar to her abdominal pain that was present before her procedure. Pt reports associated nausea, episodic vomiting, intermittent chills and sweats. She reports having one episode of emesis followed by nausea in the ED tonight. Per nurse note, pt states her abdominal pain is exacerbated when eating foods. No alleviating factors noted. Pt denies fever, chest pain and SOB.   Past Medical History:  Diagnosis Date  . Umbilical hernia 2017    Patient Active Problem List   Diagnosis Date Noted  . Acute cholecystitis due to biliary calculus 07/14/2016  . Elevated liver enzymes 03/08/2016  . Umbilical hernia 02/22/2016  . Dental decay 08/23/2015    Past Surgical History:  Procedure Laterality Date  . CHOLECYSTECTOMY N/A 07/14/2016   Procedure: LAPAROSCOPIC CHOLECYSTECTOMY;  Surgeon: Violeta Gelinas, MD;  Location: [facility] OR;  Service: General;  Laterality: N/A;  . NO PAST SURGERIES      OB History    Gravida Para Term Preterm AB Living   3 3 3  0 0 3   SAB TAB Ectopic Multiple Live Births   0 0 0 0 3       Home Medications    Prior to Admission medications   Medication Sig Start Date End  Date Taking? Authorizing Provider  levonorgestrel (MIRENA) 20 MCG/24HR IUD 1 each by Intrauterine route once.    Historical Provider, MD  ondansetron (ZOFRAN-ODT) 4 MG disintegrating tablet Take 1 tablet (4 mg total) by mouth every 6 (six) hours as needed for nausea. 07/15/16   Edson Snowball, PA-C  oxyCODONE (OXY IR/ROXICODONE) 5 MG immediate release tablet Take 1-2 tablets (5-10 mg total) by mouth every 4 (four) hours as needed for moderate pain or severe pain. 07/15/16   Edson Snowball, PA-C    Family History Family History  Problem Relation Age of Onset  . Hypertension Father     Social History Social History  Substance Use Topics  . Smoking status: Never Smoker  . Smokeless tobacco: Never Used  . Alcohol use No     Allergies   Patient has no known allergies.   Review of Systems Review of Systems  Constitutional: Positive for chills. Negative for fever.  Respiratory: Negative for shortness of breath.   Cardiovascular: Negative for chest pain.  Gastrointestinal: Positive for abdominal pain, nausea and vomiting.  All other systems reviewed and are negative.    Physical Exam Updated Vital Signs BP (!) 83/55 (BP Location: Left Arm)   Pulse 75   Temp 98.2 F (36.8 C) (Oral)   Resp 20   LMP 07/09/2016 (Approximate)   SpO2 100%   Physical Exam  Constitutional: She is oriented to person, place, and time. She appears well-developed and well-nourished.  HENT:  Head: Normocephalic and atraumatic.  Eyes: EOM are normal. Pupils are equal, round, and reactive to light.  Neck: Normal range of motion. Neck supple. No JVD present.  Cardiovascular: Normal rate, regular rhythm and normal heart sounds.   No murmur heard. Pulmonary/Chest: Effort normal and breath sounds normal. She has no wheezes. She has no rales. She exhibits no tenderness.  Abdominal: Soft. She exhibits no distension and no mass. There is tenderness.  Tenderness over epigastric and RUQ. Bowel sounds  decreased.   Musculoskeletal: Normal range of motion. She exhibits no edema.  Lymphadenopathy:    She has no cervical adenopathy.  Neurological: She is alert and oriented to person, place, and time. No cranial nerve deficit. She exhibits normal muscle tone. Coordination normal.  Skin: Skin is warm and dry. No rash noted.  Psychiatric: She has a normal mood and affect. Her behavior is normal. Judgment and thought content normal.  Nursing note and vitals reviewed.    ED Treatments / Results  DIAGNOSTIC STUDIES: Oxygen Saturation is 100% on RA, normal by my interpretation.    COORDINATION OF CARE: 12:03 AM Discussed treatment plan with pt at bedside which includes labs, EKG and nausea medication and pt agreed to plan.    Labs (all labs ordered are listed, but only abnormal results are displayed) Labs Reviewed  COMPREHENSIVE METABOLIC PANEL - Abnormal; Notable for the following:       Result Value   Glucose, Bld 147 (*)    AST 528 (*)    ALT 369 (*)    All other components within normal limits  URINALYSIS, ROUTINE W REFLEX MICROSCOPIC - Abnormal; Notable for the following:    Color, Urine AMBER (*)    APPearance CLOUDY (*)    Protein, ur 100 (*)    Bacteria, UA RARE (*)    Squamous Epithelial / LPF 0-5 (*)    All other components within normal limits  LIPASE, BLOOD  CBC  DIFFERENTIAL  HEPATITIS PANEL, ACUTE    EKG  EKG Interpretation  Date/Time:  Saturday July 19 2016 23:37:59 EST Ventricular Rate:  56 PR Interval:  138 QRS Duration: 84 QT Interval:  390 QTC Calculation: 376 R Axis:   74 Text Interpretation:  Sinus bradycardia Otherwise normal ECG Interpretation limited secondary to artifact Confirmed by Bebe ShaggyWICKLINE  MD, DONALD ([id number]) on 07/19/2016 11:42:50 PM       Radiology [place] Abdomen Limited  Result Date: 07/20/2016 CLINICAL DATA:  Right upper quadrant pain x4 hours. History of cholecystectomy 07/14/2016. EXAM: US ABDOMEN LIMITED - RIGHT UPPER QUADRANT  COMPARISON:  07/14/2016 gallbladder ultrasound FINDINGS: Gallbladder: Surgically absent. No abnormal fluid collections in the gallbladder fossa. No perihepatic fluid collections are identified. Common bile duct: Diameter: Top normal at 6 mm Liver: No focal lesion identified. Within normal limits in parenchymal echogenicity. IMPRESSION: Cholecystectomy. No choledocholithiasis. No abnormal fluid collections are identified. Electronically Signed   By: Tollie Ethavid  Kwon M.D.   On: 07/20/2016 02:29    Procedures Procedures (including critical care time)  Medications Ordered in ED Medications  morphine 4 MG/ML injection 4 mg (not administered)  ondansetron (ZOFRAN) injection 4 mg (not administered)  sodium chloride 0.9 % bolus 1,000 mL (not administered)  ondansetron (ZOFRAN) injection 4 mg (4 mg Intravenous Given 07/20/16 0002)  sodium chloride 0.9 % bolus 1,000 mL (1,000 mLs Intravenous New Bag/Given 07/20/16 0002)     Initial Impression / Assessment and Plan / ED Course  I have reviewed the triage vital signs and the  nursing notes.  Pertinent labs & imaging results that were available during my care of the patient were reviewed by me and considered in my medical decision making (see chart for details).  Patient with recent cholecystectomy coming in with recurrent upper abdominal pain similar to what she had prior to her operation. Review of old records confirms laparoscopic cholecystectomy on February 12. Because of concern about possible choledocholithiasis, she was sent for ultrasound. She was also given morphine for pain and ondansetron for nausea. Fine this, she said the pain was improved but she was feeling dizzy. She is given IV fluids but continued to complain of dizziness. On further questioning, her dizziness was actually vertigo. She is given dose of meclizine with significant improvement. Ultrasound showed no evidence of choledocholithiasis. Metabolic panel did show moderate elevation of  transaminases without elevation of alkaline phosphatase or bilirubin. Specimen was sent for hepatitis panel. I question whether she had a small common bile duct stone which has already passed. She is referred back to her PCP to recheck transaminase levels in one week, and to evaluate results of hepatitis panel. She is discharged with prescriptions for ondansetron and meclizine.  Final Clinical Impressions(s) / ED Diagnoses   Final diagnoses:  Upper abdominal pain  Elevated transaminase level  Peripheral vertigo, unspecified laterality    New Prescriptions New Prescriptions   MECLIZINE (ANTIVERT) 25 MG TABLET    Take 1 tablet (25 mg total) by mouth 3 (three) times daily as needed for dizziness.   ONDANSETRON (ZOFRAN) 4 MG TABLET    Take 1 tablet (4 mg total) by mouth every 6 (six) hours as needed for nausea or vomiting.    I personally performed the services described in this documentation, which was scribed in my presence. The recorded information has been reviewed and is accurate.       Caroline Booze, MD 07/20/16 [phone]

## 2016-07-20 NOTE — Discharge Instructions (Signed)
°  Por favor, consulte a su mdico en una semana para volver a verificar sus pruebas de hgado.

## 2016-07-20 NOTE — ED Notes (Signed)
Patient was given a [place] sandwich and apple juice along with peanut butter and graham crackers.

## 2016-07-20 NOTE — ED Notes (Signed)
Pt ambulated to bathroom with standby assist. Pt stated she felt mildy dizzy

## 2016-07-21 LAB — HEPATITIS PANEL, ACUTE
HEP A IGM: NEGATIVE
HEP B C IGM: NEGATIVE
Hepatitis B Surface Ag: NEGATIVE

## 2016-07-30 ENCOUNTER — Telehealth: Payer: Self-pay | Admitting: Internal Medicine

## 2016-07-30 NOTE — Telephone Encounter (Signed)
Patient says she went to an emergency care clinic after coming in to see Dr. Delrae AlfredMulberry back in November of 2017 for abdominal pain. She said the pain kept coming back in her abdomen and each time it was more intense than before. Patient had gallbladder surgery recently and says she is recovering and wants to know if she still should come in for hepatic labs since she was already seen at other clinic.

## 2016-07-31 ENCOUNTER — Other Ambulatory Visit: Payer: Self-pay

## 2016-07-31 NOTE — Telephone Encounter (Signed)
Patient needs follow up lab from hospital visit because one of her levels was elevated. Please inform patient

## 2016-08-06 NOTE — Telephone Encounter (Signed)
Patient was scheduled for hepatic profile 08/08/16 at 11:00 am

## 2016-08-08 ENCOUNTER — Other Ambulatory Visit (INDEPENDENT_AMBULATORY_CARE_PROVIDER_SITE_OTHER): Payer: Self-pay

## 2016-08-08 ENCOUNTER — Telehealth: Payer: Self-pay | Admitting: Internal Medicine

## 2016-08-08 DIAGNOSIS — R748 Abnormal levels of other serum enzymes: Secondary | ICD-10-CM

## 2016-08-09 LAB — HEPATIC FUNCTION PANEL
ALBUMIN: 4.3 g/dL (ref 3.5–5.5)
ALK PHOS: 67 IU/L (ref 39–117)
ALT: 17 IU/L (ref 0–32)
AST: 20 IU/L (ref 0–40)
BILIRUBIN TOTAL: 0.3 mg/dL (ref 0.0–1.2)
Bilirubin, Direct: 0.09 mg/dL (ref 0.00–0.40)
Total Protein: 6.9 g/dL (ref 6.0–8.5)

## 2016-11-01 NOTE — Addendum Note (Signed)
Addendum  created 11/01/16 0936 by Mildred Tuccillo, MD   Sign clinical note    

## 2017-04-25 IMAGING — US US ABDOMEN LIMITED
1 series · 14 of 25 positions shown · non-contrast
Comparison: None.

CLINICAL DATA: Intermittent gradually worsening epigastric pain for
7 months.

EXAM:
US ABDOMEN LIMITED - RIGHT UPPER QUADRANT

[Series 1: us abdomen limited · 0.22mm/px · 14 of 53 slices shown]
[im 1/53]
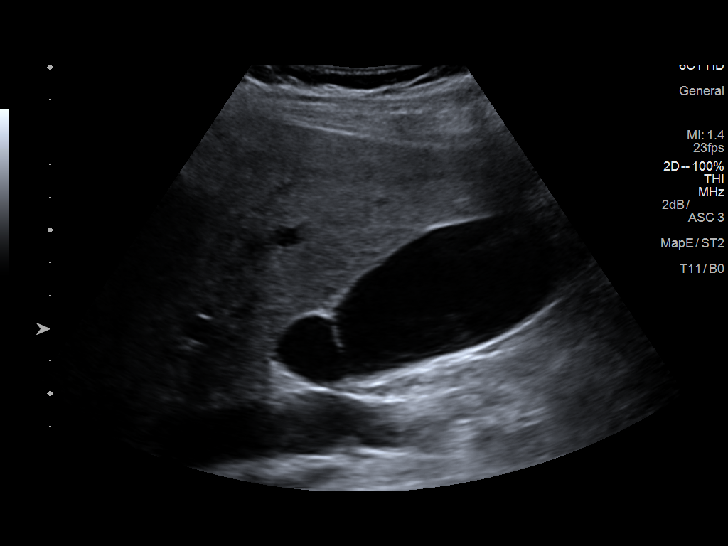
[im 5/53]
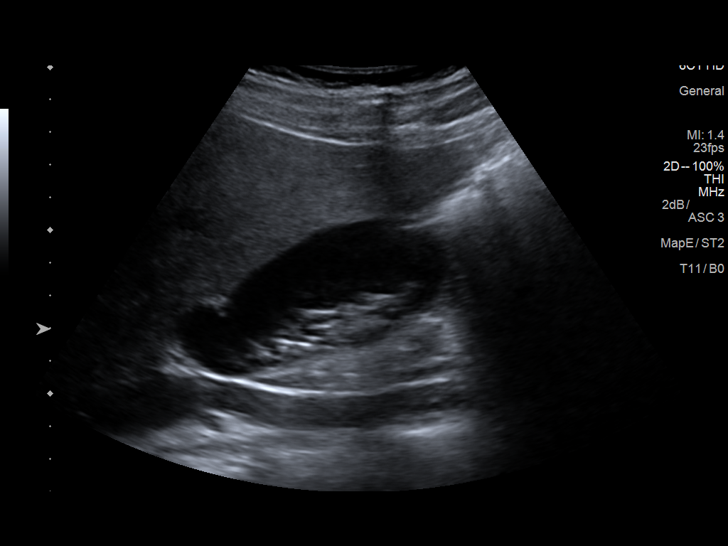
[im 9/53]
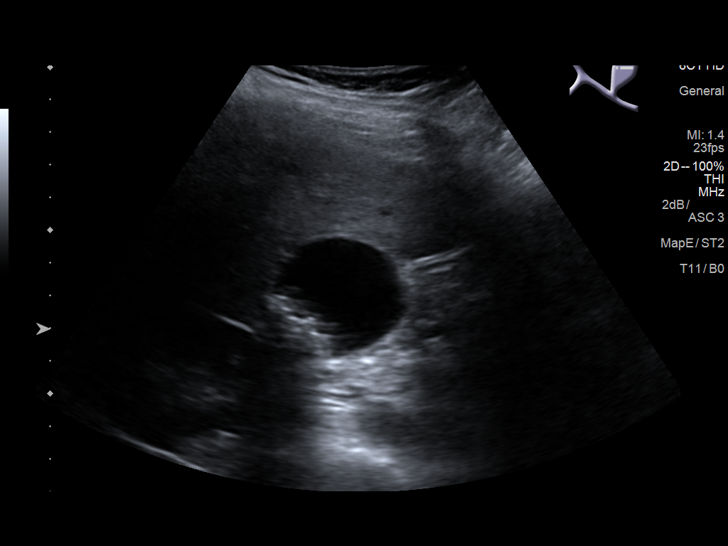
[im 14/53]
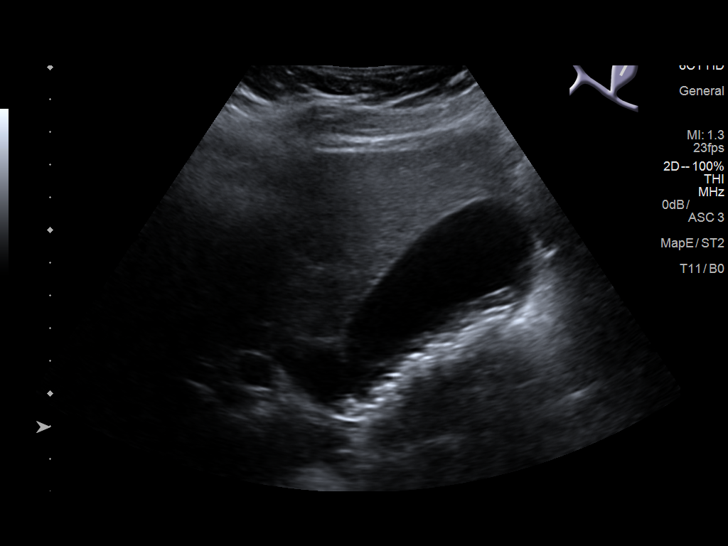
[im 18/53]
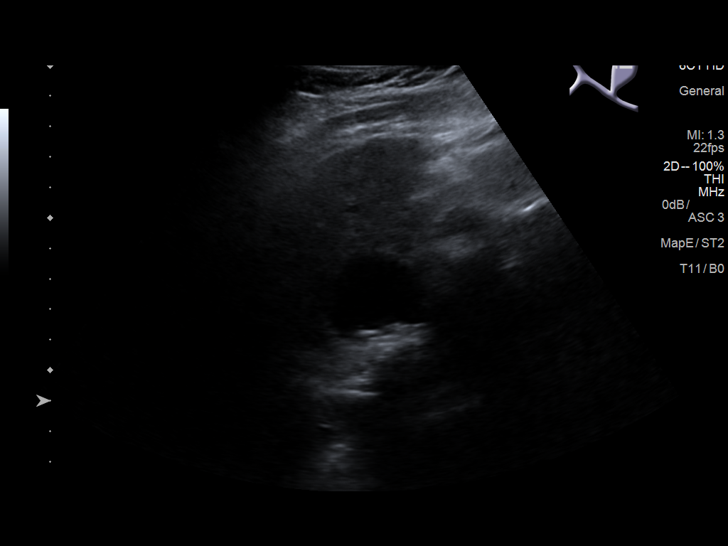
[im 20/53]
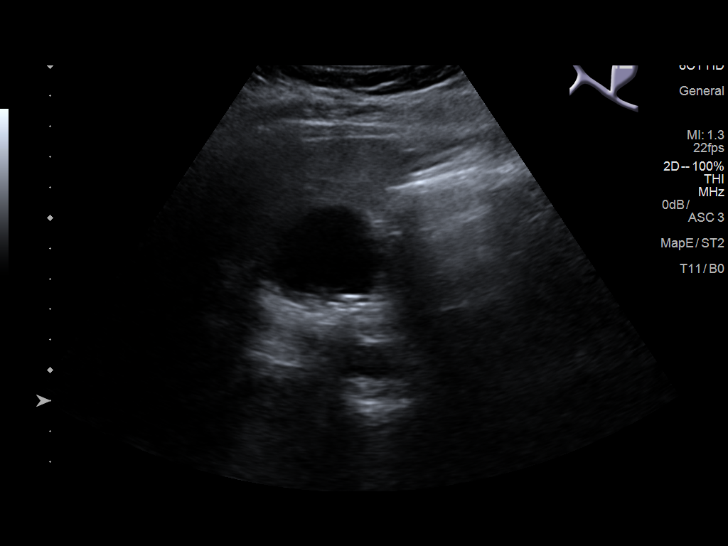
[im 24/53]
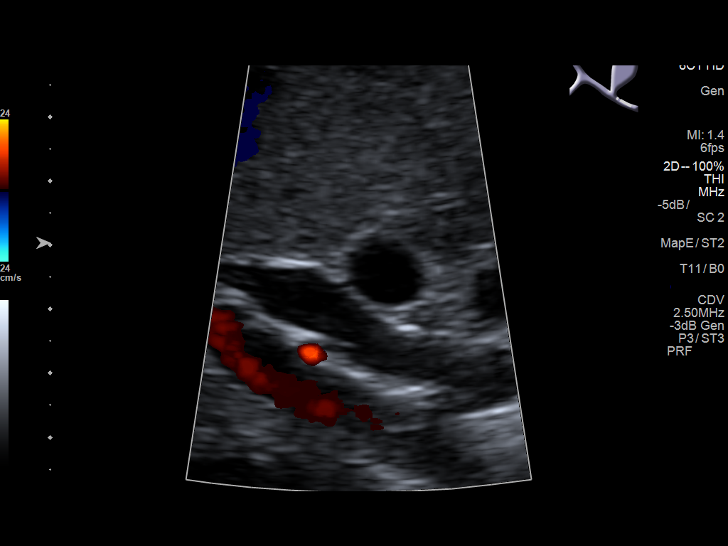
[im 29/53]
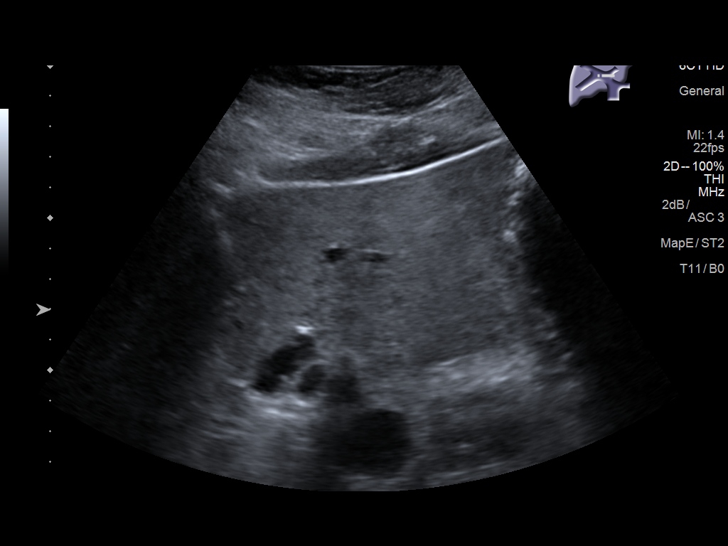
[im 33/53]
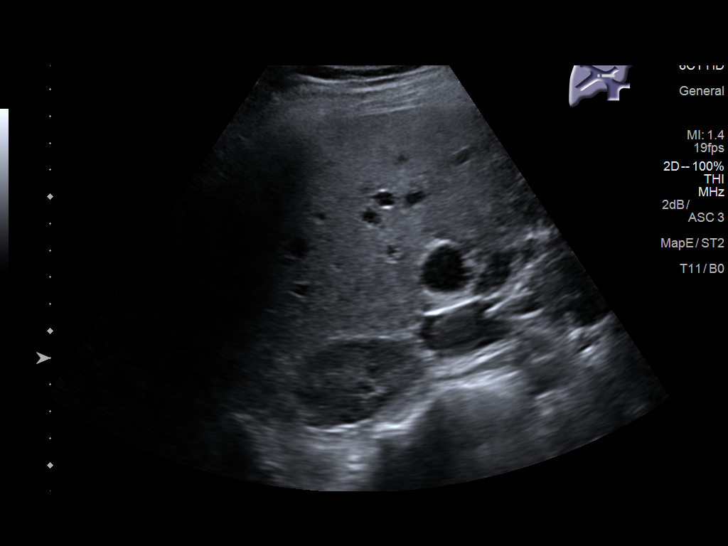
[im 35/53]
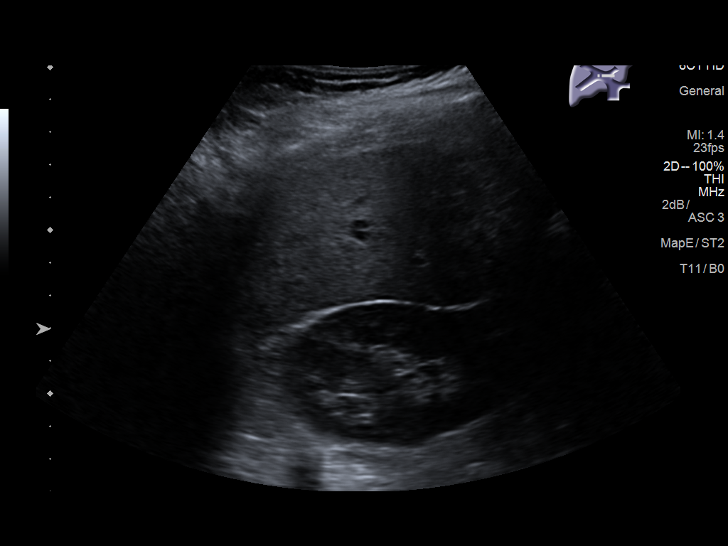
[im 40/53]
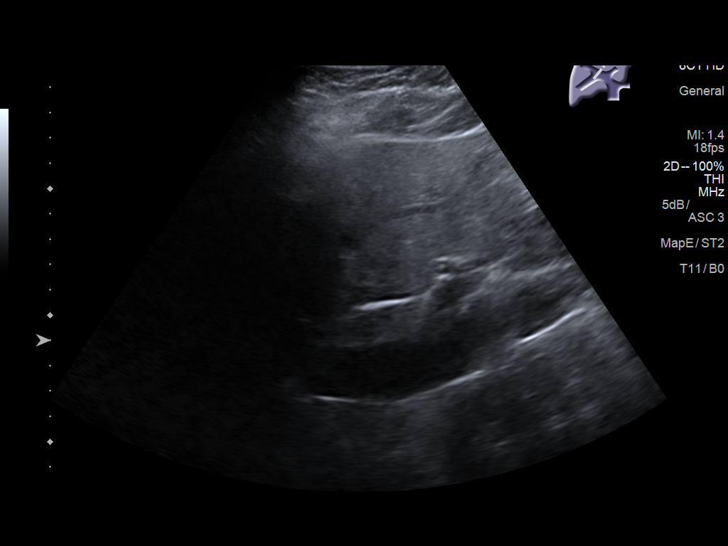
[im 44/53]
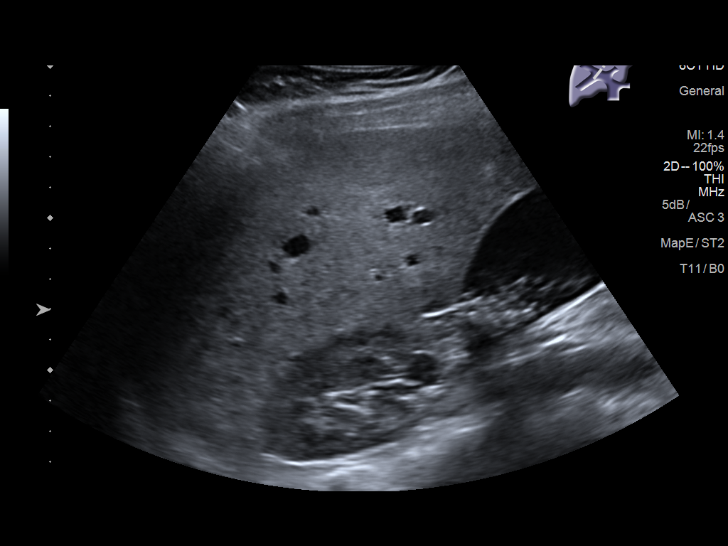
[im 48/53]
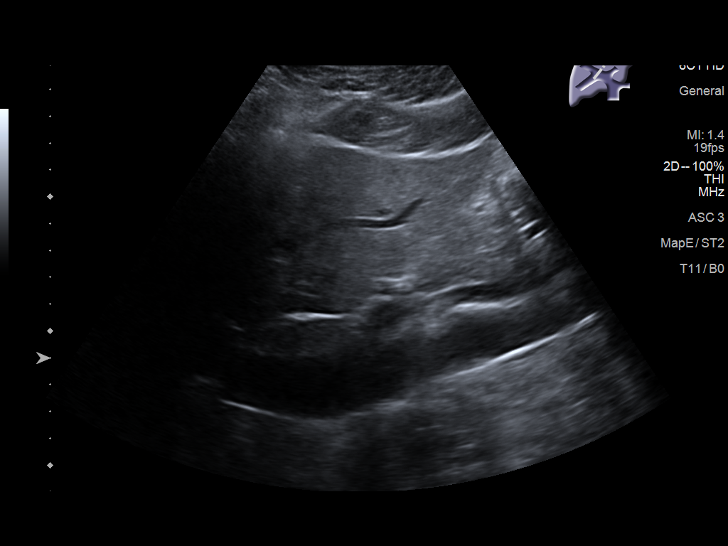
[im 53/53]
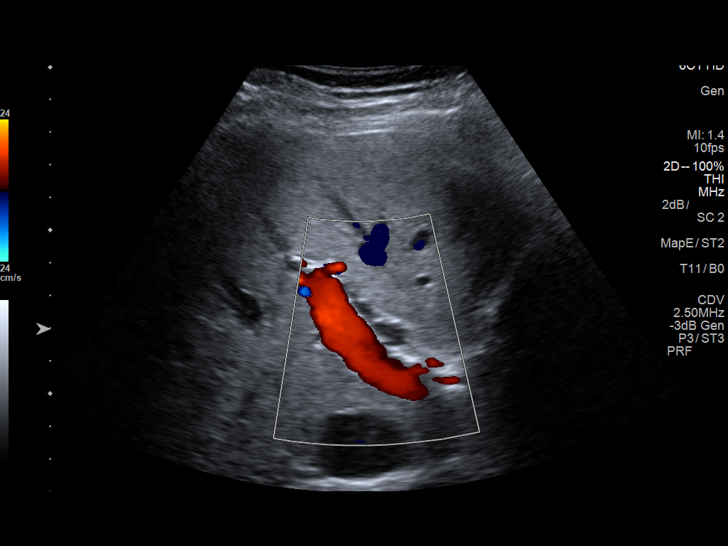

[14 of 25 positions shown; findings below may reference images not displayed]

FINDINGS: Gallbladder:

Cholelithiasis with multiple stones in the gallbladder. Largest
stone measuring about 7 mm diameter. No gallbladder wall thickening
or edema. Murphy's sign is positive.

Common bile duct:

Diameter: 9 mm, increased. No intraductal stones are visualized but
distal common bile duct is obscured by bowel gas.

Liver:

No focal lesion identified. Within normal limits in parenchymal
echogenicity.
IMPRESSION: Cholelithiasis with positive Murphy's sign. These findings are
consistent with acute cholecystitis in the appropriate clinical
setting. Mild nonspecific extrahepatic bile duct dilatation.

## 2019-12-29 ENCOUNTER — Ambulatory Visit: Payer: Self-pay | Attending: Internal Medicine

## 2019-12-29 DIAGNOSIS — Z23 Encounter for immunization: Secondary | ICD-10-CM

## 2019-12-29 NOTE — Progress Notes (Signed)
   Covid-19 Vaccination Clinic  Name:  Caroline [name]    MRN: [id number] DOB: 04-23-1986  12/29/2019  Caroline [name] was observed post Covid-19 immunization for 15 minutes without incident. She was provided with Vaccine Information Sheet and instruction to access the V-Safe system.   Caroline [name] was instructed to call 911 with any severe reactions post vaccine: Marland Kitchen Difficulty breathing  . Swelling of face and throat  . A fast heartbeat  . A bad rash all over body  . Dizziness and weakness   Immunizations Administered    Name Date Dose VIS Date Route   Pfizer COVID-19 Vaccine 12/29/2019 12:41 PM 0.3 mL 07/27/2018 Intramuscular   Manufacturer: [employer], [employer]   Lot: [id number]   NDC: [address]-7

## 2020-01-24 ENCOUNTER — Ambulatory Visit: Payer: Self-pay | Attending: Internal Medicine

## 2020-01-24 DIAGNOSIS — Z23 Encounter for immunization: Secondary | ICD-10-CM

## 2020-01-24 NOTE — Progress Notes (Signed)
   Covid-19 Vaccination Clinic  Name:  Caroline [name]    MRN: [id number] DOB: Dec 02, 1985  01/24/2020  [name] was observed post Covid-19 immunization for 15 minutes without incident. She was provided with Vaccine Information Sheet and instruction to access the V-Safe system.   [name] was instructed to call 911 with any severe reactions post vaccine: Marland Kitchen Difficulty breathing  . Swelling of face and throat  . A fast heartbeat  . A bad rash all over body  . Dizziness and weakness   Immunizations Administered    Name Date Dose VIS Date Route   Pfizer COVID-19 Vaccine 01/24/2020  1:41 PM 0.3 mL 07/27/2018 Intramuscular   Manufacturer: [employer], [employer]   Lot: [id number]   NDC: [address]-6

## 2021-02-23 ENCOUNTER — Encounter (HOSPITAL_COMMUNITY): Payer: Self-pay | Admitting: Emergency Medicine

## 2021-02-23 ENCOUNTER — Emergency Department (HOSPITAL_COMMUNITY): Payer: Self-pay

## 2021-02-23 ENCOUNTER — Emergency Department (HOSPITAL_COMMUNITY)
Admission: EM | Admit: 2021-02-23 | Discharge: 2021-02-23 | Disposition: A | Discharge status: Home / Self Care | Payer: Self-pay | Attending: Emergency Medicine | Admitting: Emergency Medicine

## 2021-02-23 ENCOUNTER — Other Ambulatory Visit: Payer: Self-pay

## 2021-02-23 DIAGNOSIS — R1011 Right upper quadrant pain: Secondary | ICD-10-CM | POA: Insufficient documentation

## 2021-02-23 DIAGNOSIS — R110 Nausea: Secondary | ICD-10-CM | POA: Insufficient documentation

## 2021-02-23 LAB — COMPREHENSIVE METABOLIC PANEL
ALT: 17 U/L (ref 0–44)
AST: 19 U/L (ref 15–41)
Albumin: 3.9 g/dL (ref 3.5–5.0)
Alkaline Phosphatase: 48 U/L (ref 38–126)
Anion gap: 7 (ref 5–15)
BUN: 9 mg/dL (ref 6–20)
CO2: 26 mmol/L (ref 22–32)
Calcium: 9.4 mg/dL (ref 8.9–10.3)
Chloride: 105 mmol/L (ref 98–111)
Creatinine, Ser: 0.67 mg/dL (ref 0.44–1.00)
GFR, Estimated: >60 mL/min (ref >60)
Glucose, Bld: 107 mg/dL — ABNORMAL HIGH (ref 70–99)
Potassium: 4.1 mmol/L (ref 3.5–5.1)
Sodium: 138 mmol/L (ref 135–145)
Total Bilirubin: 0.5 mg/dL (ref 0.3–1.2)
Total Protein: 6.6 g/dL (ref 6.5–8.1)

## 2021-02-23 LAB — URINALYSIS, ROUTINE W REFLEX MICROSCOPIC
Bacteria, UA: NONE SEEN
Bilirubin Urine: NEGATIVE
Glucose, UA: NEGATIVE mg/dL
Ketones, ur: NEGATIVE mg/dL
Leukocytes,Ua: NEGATIVE
Nitrite: NEGATIVE
Protein, ur: NEGATIVE mg/dL
Specific Gravity, Urine: 1.025 (ref 1.005–1.030)
pH: 5 (ref 5.0–8.0)

## 2021-02-23 LAB — I-STAT BETA HCG BLOOD, ED (MC, WL, AP ONLY): I-stat hCG, quantitative: <5 m[IU]/mL (ref <5)

## 2021-02-23 LAB — CBC
HCT: 44.4 % (ref 36.0–46.0)
Hemoglobin: 14.7 g/dL (ref 12.0–15.0)
MCH: 32.1 pg (ref 26.0–34.0)
MCHC: 33.1 g/dL (ref 30.0–36.0)
MCV: 96.9 fL (ref 80.0–100.0)
Platelets: 171 10*3/uL (ref 150–400)
RBC: 4.58 MIL/uL (ref 3.87–5.11)
RDW: 11.9 % (ref 11.5–15.5)
WBC: 4.6 10*3/uL (ref 4.0–10.5)
nRBC: 0 % (ref 0.0–0.2)

## 2021-02-23 LAB — LIPASE, BLOOD: Lipase: 26 U/L (ref 11–51)

## 2021-02-23 NOTE — ED Triage Notes (Signed)
C/o intermittent RUQ pain x 3 months.  Pain worse the past few days with generalized weakness.  Denies nausea, vomiting, and diarrhea.

## 2021-02-23 NOTE — ED Notes (Signed)
Sent to ultrasound at this time

## 2021-02-23 NOTE — Discharge Instructions (Signed)
You are seen in the emergency department today for abdominal pain in your right upper quadrant.  While you were here we obtained labs and an ultrasound of your right upper quadrant which was all reassuring.  It does not appear that you have a gallstone however you may have passed 1 over the past few days.  It is important that you continue to follow a diet that is low in fats, greasy food as to not exacerbate any of your symptoms.  Please follow-up with your primary care provider in the next week for ongoing management.  Please also return to the emergency department for any reason, however regarding your right upper quadrant pain please return if the pain is increasing, you begin to have nausea or vomiting, fevers, or feel that your abdomen is distended.  Hoy lo atienden en el departamento de emergencias por dolor abdominal en el cuadrante superior derecho. Mientras estuvo aqu obtuvimos laboratorios y [place] de su cuadrante superior derecho, lo cual fue tranquilizador. No parece que tenga un clculo biliar, sin embargo, es posible que haya expulsado uno en los [address]. Es importante que sigas una dieta baja en grasas, comida grasosa para no exacerbar ninguno de tus sntomas. Haga un seguimiento con su proveedor de atencin primaria en la prxima semana para el control continuo. Tambin regrese al departamento de emergencias por cualquier motivo, sin embargo, con respecto a su dolor en el cuadrante superior derecho, regrese si el dolor aumenta, comienza a tener nuseas o vmitos, fiebre o si siente que su abdomen est distendido.

## 2021-02-23 NOTE — ED Provider Notes (Signed)
[facility] EMERGENCY DEPARTMENT Provider Note   CSN: [id number] Arrival date & time: 02/23/21  0800     History Chief Complaint  Patient presents with   Abdominal Pain    Caroline [name] is a 35 y.o. female.  With past medical history of cholecystitis s/p cholecystectomy in 07/2016 who presents to the emergency department for abdominal pain.  States that she has had right upper quadrant abdominal pain for 3 months.  However, over the past 3 days has had increasing right upper quadrant abdominal pain associated with fatigue.  Describes it as dull, moderate, intermittent pain.  Nonradiating.  Denies aggravating or alleviating factors. Denies nausea, vomiting, diarrhea, bloating, distention, changes to her skin or eye color, fevers, shortness of breath, chest pain, cough, urinary symptoms.  Denies alcohol or excessive Tylenol use.    Abdominal Pain Associated symptoms: fatigue   Associated symptoms: no constipation, no diarrhea, no dysuria, no fever, no nausea and no vomiting       Past Medical History:  Diagnosis Date   Umbilical hernia 2017    Patient Active Problem List   Diagnosis Date Noted   Acute cholecystitis due to biliary calculus 07/14/2016   Elevated liver enzymes 03/08/2016   Umbilical hernia 02/22/2016   Dental decay 08/23/2015    Past Surgical History:  Procedure Laterality Date   CHOLECYSTECTOMY N/A 07/14/2016   Procedure: LAPAROSCOPIC CHOLECYSTECTOMY;  Surgeon: Violeta Gelinas, MD;  Location: [facility] OR;  Service: General;  Laterality: N/A;   NO PAST SURGERIES       OB History     Gravida  3   Para  3   Term  3   Preterm  0   AB  0   Living  3      SAB  0   IAB  0   Ectopic  0   Multiple  0   Live Births  3           Family History  Problem Relation Age of Onset   Hypertension Father     Social History   Tobacco Use   Smoking status: Never   Smokeless tobacco: Never  Substance Use Topics   Alcohol  use: No   Drug use: No    Home Medications Prior to Admission medications   Medication Sig Start Date End Date Taking? Authorizing Provider  levonorgestrel (MIRENA) 20 MCG/24HR IUD 1 each by Intrauterine route once.    [provider]  meclizine (ANTIVERT) 25 MG tablet Take 1 tablet (25 mg total) by mouth 3 (three) times daily as needed for dizziness. 07/20/16   [name], MD  ondansetron (ZOFRAN) 4 MG tablet Take 1 tablet (4 mg total) by mouth every 6 (six) hours as needed for nausea or vomiting. 07/20/16   [name], MD  ondansetron (ZOFRAN-ODT) 4 MG disintegrating tablet Take 1 tablet (4 mg total) by mouth every 6 (six) hours as needed for nausea. 07/15/16   Meuth, Brooke A, PA-C  oxyCODONE (OXY IR/ROXICODONE) 5 MG immediate release tablet Take 1-2 tablets (5-10 mg total) by mouth every 4 (four) hours as needed for moderate pain or severe pain. 07/15/16   Meuth, Lina Sar, PA-C    Allergies    Patient has no known allergies.  Review of Systems   Review of Systems  Constitutional:  Positive for fatigue. Negative for appetite change and fever.  Gastrointestinal:  Positive for abdominal pain. Negative for abdominal distention, constipation, diarrhea, nausea and vomiting.  Genitourinary:  Negative for dysuria and flank pain.  Skin:  Negative for color change.  All other systems reviewed and are negative.  Physical Exam Updated Vital Signs BP 107/73 (BP Location: Right Arm)   Pulse 72   Temp 98 F (36.7 C) (Oral)   Resp 16   SpO2 100%   Physical Exam Vitals and nursing note reviewed.  Constitutional:      General: She is not in acute distress.    Appearance: Normal appearance. She is well-developed. She is not toxic-appearing.  HENT:     Head: Normocephalic and atraumatic.     Mouth/Throat:     Mouth: Mucous membranes are moist.     Pharynx: Oropharynx is clear.  Eyes:     General: No scleral icterus.    Extraocular Movements: Extraocular movements intact.   Cardiovascular:     Rate and Rhythm: Normal rate and regular rhythm.     Heart sounds: Normal heart sounds. No murmur heard. Pulmonary:     Effort: Pulmonary effort is normal. No respiratory distress.     Breath sounds: Normal breath sounds.  Abdominal:     General: A surgical scar is present. Bowel sounds are normal. There is no distension.     Palpations: Abdomen is soft. There is no hepatomegaly.     Tenderness: There is abdominal tenderness in the right upper quadrant. There is no right CVA tenderness, left CVA tenderness, guarding or rebound. Positive signs include Murphy's sign. Negative signs include Rovsing's sign and McBurney's sign.     Hernia: A hernia is present. Hernia is present in the umbilical area.  Musculoskeletal:     Right lower leg: No edema.     Left lower leg: No edema.  Skin:    General: Skin is warm and dry.     Capillary Refill: Capillary refill takes less than 2 seconds.     Coloration: Skin is not jaundiced.  Neurological:     General: No focal deficit present.     Mental Status: She is alert and oriented to person, place, and time.  Psychiatric:        Mood and Affect: Mood normal.        Behavior: Behavior normal.    ED Results / Procedures / Treatments   Labs (all labs ordered are listed, but only abnormal results are displayed) Labs Reviewed  COMPREHENSIVE METABOLIC PANEL - Abnormal; Notable for the following components:      Result Value   Glucose, Bld 107 (*)    All other components within normal limits  URINALYSIS, ROUTINE W REFLEX MICROSCOPIC - Abnormal; Notable for the following components:   APPearance HAZY (*)    Hgb urine dipstick MODERATE (*)    All other components within normal limits  LIPASE, BLOOD  CBC  I-STAT BETA HCG BLOOD, ED ([facility], [facility], [facility] ONLY)   EKG None  Radiology US Abdomen Limited RUQ (LIVER/GB)  Result Date: 02/23/2021 CLINICAL DATA:  Right upper quadrant abdominal pain. Cholecystectomy. EXAM: ULTRASOUND ABDOMEN  LIMITED RIGHT UPPER QUADRANT COMPARISON:  07/20/2016 FINDINGS: Gallbladder: Surgically absent. Common bile duct: Diameter: 5 mm Liver: Increased parenchymal echogenicity suggestive of hepatic steatosis. No focal liver abnormality. Portal vein is patent on color Doppler imaging with normal direction of blood flow towards the liver. Other: None. IMPRESSION: 1. No acute abnormality. 2. Hepatic steatosis. 3. Cholecystectomy. Electronically Signed   By: Signa Kell M.D.   On: 02/23/2021 10:09    Procedures Procedures   Medications Ordered in ED Medications -  No data to display  ED Course  I have reviewed the triage vital signs and the nursing notes.  Pertinent labs & imaging results that were available during my care of the patient were reviewed by me and considered in my medical decision making (see chart for details).  Declines pain medication. MDM Rules/Calculators/A&P 35 year old female who presents emergency department with right upper quadrant abdominal pain.  Her abdominal exam is unremarkable without peritoneal signs.  No evidence of an acute abdomen at this time.  She is well-appearing and nontoxic. Presentations not consistent with acute pancreatitis, lipase negative. Less likely to be an acute infectious process such as pneumonia, hepatitis, pyelonephritis as she has no fever, WBC 4.6, no cough, no flank pain or urinary symptoms. Low suspicion that this is peptic ulcer. Presentation not consistent with an acute appendicitis, ACS.  Likely biliary colic, and may have passed a gallstone over the past few days.  Her common bile duct diameter is 5 mm on right upper quadrant ultrasound.  LFTs within normal limits.  At this time, work-up is reassuring.  I have spoken with the patient at bedside regarding her lab results and imaging.  I have answered all questions.  We will have her follow-up with primary care provider in the next week if she has ongoing similar symptoms.  She understands  return to emergency department if she has increasing abdominal pain, fevers, intractable nausea and vomiting, abdominal distention.  Vital signs stable. She is safe for discharge  Final Clinical Impression(s) / ED Diagnoses Final diagnoses:  RUQ abdominal pain    Rx / DC Orders ED Discharge Orders     None        Cristopher Peru, PA-C 02/23/21 1039    Horton, Clabe Seal, DO 02/25/21 [phone]

## 2022-05-22 LAB — OB RESULTS CONSOLE VARICELLA ZOSTER ANTIBODY, IGG: Varicella: IMMUNE

## 2022-05-22 LAB — OB RESULTS CONSOLE ABO/RH: RH Type: POSITIVE

## 2022-05-22 LAB — OB RESULTS CONSOLE GC/CHLAMYDIA
Chlamydia: NEGATIVE
Neisseria Gonorrhea: NEGATIVE

## 2022-05-22 LAB — OB RESULTS CONSOLE HGB/HCT, BLOOD
HCT: 38 (ref 29–41)
Hemoglobin: 12.6

## 2022-05-22 LAB — OB RESULTS CONSOLE RPR: RPR: NONREACTIVE

## 2022-05-22 LAB — OB RESULTS CONSOLE PLATELET COUNT: Platelets: 165

## 2022-05-22 LAB — OB RESULTS CONSOLE RUBELLA ANTIBODY, IGM: Rubella: IMMUNE

## 2022-05-22 LAB — OB RESULTS CONSOLE ANTIBODY SCREEN: Antibody Screen: NEGATIVE

## 2022-05-30 LAB — GLUCOSE TOLERANCE, 1 HOUR: Glucose, 1 Hour GTT: 195

## 2022-05-31 ENCOUNTER — Other Ambulatory Visit: Payer: Self-pay

## 2022-06-02 NOTE — L&D Delivery Note (Addendum)
Delivery Note Caroline [name] is a 37 y.o. [phone] at [redacted]w[redacted]d admitted for IOL for A2DM.   GBS Status:  Negative/-- (05/02 1622)  Labor course: Initial SVE: 4/50/-1. Augmentation with: AROM and Pitocin. She then progressed to complete.  ROM: 1h 47m with light mec fluid  Birth: Delivery of a Live born female  Birth Weight:   APGAR: 8, 9  Newborn Delivery   Birth date/time: 10/[birth year]20[birth year] 06:44:42 Delivery type: Vaginal, Spontaneous         Delivered via spontaneous vaginal delivery (Presentation: OA ). Nuchal cord present: No.  Shoulders and body delivered in usual fashion. Infant placed directly on mom's abdomen for bonding/skin-to-skin, baby dried and stimulated. Cord clamped x 2 after 1 minute and cut by FOB.  Cord blood collected. Placenta delivered-Spontaneous  with 3 vessels . 20u Pitocin in 500cc LR given as a bolus prior to delivery of placenta.  Fundus firm with massage. Placenta inspected and appears to be intact with a 3 VC.  Sponge and instrument count were correct x2.  Intrapartum complications:  None Anesthesia:  epidural Lacerations:  none Suture Repair:  EBL (mL):208.00     Mom to postpartum.  Baby to Couplet care / Skin to Skin. Placenta to L&D   Plans to Breast and bottlefeed Contraception: IUD Circumcision: declines  Note sent to [facility]: N/A, [facility] pt for pp visit.  Delivery Report:  Review the Delivery Report for details.     Signed: Jacklyn Shell, DNP,CNM 10/10/2022, 7:37 AM

## 2022-06-05 ENCOUNTER — Encounter: Payer: Self-pay | Admitting: Registered"

## 2022-06-05 ENCOUNTER — Encounter: Payer: Self-pay | Attending: Family Medicine | Admitting: Registered"

## 2022-06-05 ENCOUNTER — Ambulatory Visit (INDEPENDENT_AMBULATORY_CARE_PROVIDER_SITE_OTHER): Payer: Self-pay | Admitting: Registered"

## 2022-06-05 VITALS — Wt 175.0 lb

## 2022-06-05 DIAGNOSIS — Z3A00 Weeks of gestation of pregnancy not specified: Secondary | ICD-10-CM

## 2022-06-05 DIAGNOSIS — O24419 Gestational diabetes mellitus in pregnancy, unspecified control: Secondary | ICD-10-CM

## 2022-06-05 NOTE — Progress Notes (Signed)
Patient was seen for Gestational Diabetes self-management on 06/05/2022  Start time 1118 and End time 1219   Estimated due date: 10/26/22 Future appt: New Ob intake 06/17/2022  AMN video Interpreter Valarie Merino #[id number]  Clinical: Medications: reviewed Medical History: reviewed Labs: 1-hr GTT 195 mg/dL, A1c n/a%   Dietary and Lifestyle History: Pt states there is diabetes on the baby's fathers side. Pt states she has no prior experience with diabetes.   Pt reports she is very sleepy, not enough energy to exercise, occasionally will go for a walk.  Pt reports dietary intake pattern: vegetables 2-3 x/week, beans 3-4 x/week, doesn't drink straight milk, dislikes,stopped eating yogurt d/t belief it wasn't good.  Physical Activity: ADL Stress: not assessed Sleep: pt reports she sleeps a lot, not sure how many hours  24 hr Recall:  First Meal: flavored oatmeal with milk, 1 apple (POC 111 mg/dL 1 hr PPBG) Snack: fruit Second meal: chicken and salad, tacos Snack: fruit Third meal: same as lunch Snack: apple Beverages: mostly water, part of coke rarely, 1/2 c milk with cream and sugar  NUTRITION INTERVENTION  Nutrition education (E-1) on the following topics:   Initial Follow-up  [x]  []  Definition of Gestational Diabetes []  []  Why dietary management is important in controlling blood glucose [x]  []  Effects each nutrient has on blood glucose levels []  []  Simple carbohydrates vs complex carbohydrates []  []  Fluid intake [x]  []  Creating a balanced meal plan [x]  []  Carbohydrate counting  [x]  []  When to check blood glucose levels [x]  []  Proper blood glucose monitoring techniques [x]  []  Effect of stress and stress reduction techniques  [x]  []  Exercise effect on blood glucose levels, appropriate exercise during pregnancy []  []  Importance of limiting caffeine and abstaining from alcohol and smoking []  []  Medications used for blood sugar control during pregnancy []  []  Hypoglycemia and rule  of 15 [x]  []  Postpartum self care  Blood glucose monitor given: [employer] Lot # [id number] Lot # [id number] (strips) Exp: 2023-10-05 (strips) CBG: 111 mg/dL  Patient instructed to monitor glucose levels: FBS: 60 - ? 95 mg/dL; 2 hour: ? 120 mg/dL  Patient received handouts: Nutrition Diabetes and Pregnancy Carbohydrate Counting List  Patient will be seen for follow-up as needed. Pt declined follow-up

## 2022-06-16 ENCOUNTER — Encounter: Payer: Self-pay | Admitting: *Deleted

## 2022-06-17 ENCOUNTER — Ambulatory Visit (INDEPENDENT_AMBULATORY_CARE_PROVIDER_SITE_OTHER): Payer: Self-pay | Admitting: Family Medicine

## 2022-06-17 ENCOUNTER — Encounter: Payer: Self-pay | Admitting: Family Medicine

## 2022-06-17 VITALS — BP 109/72 | HR 90 | Ht 64.0 in | Wt 177.3 lb

## 2022-06-17 DIAGNOSIS — O0990 Supervision of high risk pregnancy, unspecified, unspecified trimester: Secondary | ICD-10-CM | POA: Insufficient documentation

## 2022-06-17 DIAGNOSIS — Z9049 Acquired absence of other specified parts of digestive tract: Secondary | ICD-10-CM | POA: Insufficient documentation

## 2022-06-17 DIAGNOSIS — O24410 Gestational diabetes mellitus in pregnancy, diet controlled: Secondary | ICD-10-CM

## 2022-06-17 DIAGNOSIS — O219 Vomiting of pregnancy, unspecified: Secondary | ICD-10-CM

## 2022-06-17 DIAGNOSIS — K429 Umbilical hernia without obstruction or gangrene: Secondary | ICD-10-CM

## 2022-06-17 LAB — OB RESULTS CONSOLE HEPATITIS B SURFACE ANTIGEN: Hepatitis B Surface Ag: NEGATIVE

## 2022-06-17 LAB — OB RESULTS CONSOLE HIV ANTIBODY (ROUTINE TESTING): HIV: NONREACTIVE

## 2022-06-17 LAB — HEPATITIS C ANTIBODY: HCV Ab: NEGATIVE

## 2022-06-17 MED ORDER — ASPIRIN 81 MG PO TBEC: 81.0000 mg | DELAYED_RELEASE_TABLET | Freq: Every day | ORAL | 12 refills | Status: DC

## 2022-06-17 MED ORDER — ONDANSETRON 4 MG PO TBDP: 4.0000 mg | ORAL_TABLET | Freq: Four times a day (QID) | ORAL | 2 refills | Status: DC | PRN

## 2022-06-17 NOTE — Patient Instructions (Signed)
Segundo trimestre de embarazo Second Trimester of Pregnancy  El segundo trimestre de embarazo va desde la semana 13 hasta la semana 27. Es decir desde el mes 4 hasta el mes 6 de embarazo. El segundo trimestre suele ser el momento en el que mejor se siente. Su organismo se ha adaptado a estar embarazada, y comienza a sentirse fsicamente mejor. Durante el segundo trimestre: Las nuseas del embarazo han disminuido o han desaparecido completamente. Usted puede tener ms energa. Es posible que tenga un aumento del apetito. El segundo trimestre es tambin un perodo en el que el beb en gestacin (feto) crece rpidamente. Hacia el final del sexto mes, el feto puede medir aproximadamente 12 pulgadas y pesar alrededor de 1 libras. Es probable que sienta que el beb se mueve (da pataditas) entre las 16 y 20semanas del embarazo. Cambios en el cuerpo durante el segundo trimestre Su cuerpo continua experimentando numerosos cambios durante su segundo trimestre. Los cambios varan y generalmente vuelven a la normalidad despus del nacimiento del beb. Cambios fsicos Seguir aumentando de peso. Notar que la parte baja del abdomen sobresale. Podrn aparecer las primeras estras en las caderas, el abdomen y las mamas. Las mamas seguirn creciendo y se tornarn sensibles. Pueden aparecer zonas oscuras o manchas (cloasma o mscara del embarazo) en el rostro. Es posible que se forme una lnea oscura desde el ombligo hasta la zona del pubis (linea nigra). Tal vez haya cambios en el cabello. Esto cambios pueden incluir su engrosamiento, crecimiento rpido y cambios en la textura. A algunas personas tambin se les cae el cabello durante o despus del embarazo, o tienen el cabello seco o fino. Cambios en la salud Comienza a tener dolores de cabeza. Es posible que tenga acidez estomacal. Puede tener estreimiento. Pueden aparecer hemorroides o abultarse e hincharse las venas (venas varicosas). Las encas pueden  sangrar y estar sensibles al cepillado y al hilo dental. Tal vez tenga necesidad de orinar con ms frecuencia porque el feto est ejerciendo presin sobre la vejiga. Puede sentir dolor en la espalda. Esto se debe a: Aumento de peso. Las hormonas del embarazo relajan las articulaciones en la pelvis. Un cambio en el peso y los msculos que ayudan a mantener su equilibrio. Siga estas instrucciones en su casa: Medicamentos Siga las instrucciones del mdico en relacin con el uso de medicamentos. Durante el embarazo, hay medicamentos que pueden tomarse y otros que no. No tome medicamentos a menos que lo haya autorizado el mdico. Tome vitaminas prenatales que contengan por lo menos 600microgramos (mcg) de cido flico. Comida y bebida Lleve una dieta saludable que incluya frutas y verduras frescas, cereales integrales, buenas fuentes de protenas como carnes magras, huevos o tofu, y productos lcteos descremados. Evite la carne cruda y el jugo, la leche y el queso sin pasteurizar. Estos portan grmenes que pueden provocar dao tanto a usted como al beb. Es posible que tenga que tomar estas medidas para prevenir o tratar el estreimiento: Beber suficiente lquido como para mantener la orina de color amarillo plido. Consumir alimentos ricos en fibra, como frijoles, cereales integrales, y frutas y verduras frescas. Limitar el consumo de alimentos ricos en grasa y azcares procesados, como los alimentos fritos o dulces. Actividad Haga ejercicio solamente como se lo haya indicado el mdico. La mayora de las personas pueden continuar su rutina de ejercicios durante el embarazo. Intente realizar como mnimo 30minutos de actividad fsica por lo menos 5das a la semana. Deje de hacer ejercicio si comienza a tener contracciones en   el tero. Deje de hacer ejercicio si le aparecen dolor o clicos en la parte baja del vientre o de la espalda. Evite hacer ejercicio si hace mucho calor o humedad, o si se  encuentra a una altitud elevada. Evite levantar pesos excesivos. Si lo desea, puede seguir teniendo relaciones sexuales, salvo que el mdico le indique lo contrario. Alivio del dolor y del malestar Use un sujetador que le brinde buen soporte para prevenir las molestias causadas por la sensibilidad en las mamas. Dese baos de asiento con agua tibia para aliviar el dolor o las molestias causadas por las hemorroides. Use una crema para las hemorroides si el mdico la autoriza. Descanse con las piernas levantadas (elevadas) si tiene calambres en las piernas o dolor en la parte baja de la espalda. Si tiene venas varicosas: Use medias de compresin como se lo haya indicado el mdico. Eleve los pies durante 15minutos, 3 o 4veces por da. Limite el consumo de sal en su dieta. Seguridad Use el cinturn de seguridad en todo momento mientras conduce o va en auto. Hable con el mdico si es vctima de maltrato verbal o fsico. Estilo de vida No se d baos de inmersin en agua caliente, baos turcos ni saunas. No se haga duchas vaginales. No use tampones ni toallas higinicas perfumadas. Evite el contacto con las bandejas sanitarias de los gatos y la tierra que estos animales usan. Estos elementos contienen bacterias que pueden causar defectos congnitos al beb y la posible prdida del feto debido a un aborto espontneo o muerte fetal. No use remedios a base de hierbas, alcohol, drogas ilegales ni medicamentos que no estn aprobados por el mdico. Las sustancias qumicas de estos productos pueden daar al beb. No consuma ningn producto que contenga nicotina o tabaco, como cigarrillos, cigarrillos electrnicos y tabaco de mascar. Si necesita ayuda para dejar de fumar, consulte al mdico. Instrucciones generales Durante una visita prenatal de rutina, el mdico le har un examen fsico y otras pruebas. Tambin le hablar sobre su salud general. Cumpla con todas las visitas de seguimiento. Esto es  importante. Pdale al mdico que la derive a clases de educacin prenatal en su localidad. Pida ayuda si tiene necesidades nutricionales o de asesoramiento durante el embarazo. El mdico puede aconsejarla o derivarla a especialistas para que la ayuden con diferentes necesidades. Dnde buscar ms informacin American Pregnancy Association (Asociacin Americana del Embarazo): americanpregnancy.org American College of Obstetricians and Gynecologists (Colegio Estadounidense de Obstetras y Gineclogos): acog.org/womens-health/pregnancy? Office on Women's Health (Oficina para la Salud de la Mujer): womenshealth.gov/pregnancy Comunquese con un mdico si tiene: Un dolor de cabeza que no desaparece despus de tomar analgsicos. Cambios en la visin o ve manchas delante de los ojos. Clicos leves, presin en la pelvis o dolor persistente en el abdomen. Nuseas persistentes, vmitos o diarrea. Secrecin vaginal con mal olor u orina con olor ftido. Dolor al orinar. Hinchazn sbita o extrema del rostro, las manos, los tobillos, los pies o las piernas. Fiebre. Busque ayuda de inmediato si: Tiene una prdida de lquido por la vagina. Tiene sangrado ligero o manchas vaginales. Tiene dolor intenso o clicos en el abdomen. Presenta dificultad para respirar. Siente dolor en el pecho. Tiene episodios de desmayo. No ha sentido a su beb moverse durante el perodo de tiempo que le indic el mdico. Tiene dolor, hinchazn o enrojecimiento nuevos o ms intensos en un brazo o una pierna. Resumen El segundo trimestre de embarazo va desde la semana 13 hasta la 27 (desde el mes 4 hasta   el 6). No use remedios a base de hierbas, alcohol, drogas ilegales ni medicamentos que no estn aprobados por el mdico. Las sustancias qumicas de estos productos pueden daar al beb. Haga ejercicio solamente como se lo haya indicado el mdico. La mayora de las personas pueden continuar su rutina de ejercicios durante el  embarazo. Cumpla con todas las visitas de seguimiento. Esto es importante. Esta informacin no tiene como fin reemplazar el consejo del mdico. Asegrese de hacerle al mdico cualquier pregunta que tenga. Document Revised: 11/28/2019 Document Reviewed: 11/28/2019 Elsevier Patient Education  2023 Elsevier Inc.  Eleccin del mtodo anticonceptivo Contraception Choices La anticoncepcin, o los mtodos anticonceptivos, hace referencia a los mtodos o dispositivos que evitan el embarazo. Mtodos hormonales  Implante anticonceptivo Un implante anticonceptivo consiste en un tubo delgado de plstico que contiene una hormona que evita el embarazo. Es diferente de un dispositivo intrauterino (DIU). Un mdico lo inserta en la parte superior del brazo. Los implantes pueden ser eficaces durante un mximo de 3 aos. Inyecciones de progestina sola Las inyecciones de progestina sola contienen progestina, una forma sinttica de la hormona progesterona. Un mdico las administra cada 3 meses. Pldoras anticonceptivas Las pldoras anticonceptivas son pastillas que contienen hormonas que evitan el embarazo. Deben tomarse una vez al da, preferentemente a la misma hora cada da. Se necesita una receta para utilizar este mtodo anticonceptivo. Parche anticonceptivo El parche anticonceptivo contiene hormonas que evitan el embarazo. Se coloca en la piel, debe cambiarse una vez a la semana durante tres semanas y debe retirarse en la cuarta semana. Se necesita una receta para utilizar este mtodo anticonceptivo. Anillo vaginal Un anillo vaginal contiene hormonas que evitan el embarazo. Se coloca en la vagina durante tres semanas y se retira en la cuarta semana. Luego se repite el proceso con un anillo nuevo. Se necesita una receta para utilizar este mtodo anticonceptivo. Anticonceptivo de emergencia Los anticonceptivos de emergencia son mtodos para evitar un embarazo despus de tener sexo sin proteccin. Vienen en  forma de pldora y pueden tomarse hasta 5 das despus de tener sexo. Funcionan mejor cuando se toman lo ms pronto posible luego de tener sexo. La mayora de los anticonceptivos de emergencia estn disponibles sin receta mdica. Este mtodo no debe utilizarse como el nico mtodo anticonceptivo. Mtodos de barrera  Condn masculino Un condn masculino es una vaina delgada que se coloca sobre el pene durante el sexo. Los condones evitan que el esperma ingrese en el cuerpo de la mujer. Pueden utilizarse con un una sustancia que mata a los espermatozoides (espermicida) para aumentar la efectividad. Deben desecharse despus de un uso. Condn femenino Un condn femenino es una vaina blanda y holgada que se coloca en la vagina antes de tener sexo. El condn evita que el esperma ingrese en el cuerpo de la mujer. Deben desecharse despus de un uso. Diafragma Un diafragma es una barrera blanda con forma de cpula. Se inserta en la vagina antes del sexo, junto con un espermicida. El diafragma bloquea el ingreso de esperma en el tero, y el espermicida mata a los espermatozoides. El diafragma debe permanecer en la vagina durante 6 a 8 horas despus de tener sexo y debe retirarse en el plazo de las 24 horas. Un diafragma es recetado y colocado por un mdico. Debe reemplazarse cada 1 a 2 aos, despus de dar a luz, de aumentar ms de 15lb (6.8kg) y de una ciruga plvica. Capuchn cervical Un capuchn cervical es una copa redonda y blanda de ltex o   plstico que se coloca en el cuello uterino. Se inserta en la vagina antes del sexo, junto con un espermicida. Bloquea el ingreso del esperma en el tero. El capuchn debe permanecer en el lugar durante 6 a 8 horas despus de tener sexo y debe retirarse en el plazo de las 48 horas. Un capuchn cervical debe ser recetado y colocado por un mdico. Debe reemplazarse cada 2aos. Esponja Una esponja es una pieza blanda y circular de espuma de poliuretano que contiene  espermicida. La esponja ayuda a bloquear el ingreso de esperma en el tero, y el espermicida mata a los espermatozoides. Para utilizarla, debe humedecerla e insertarla en la vagina. Debe insertarse antes de tener sexo, debe permanecer dentro al menos durante 6 horas despus de tener sexo y debe retirarse y desecharse en el plazo de las 30 horas. Espermicidas Los espermicidas son sustancias qumicas que matan o bloquean al esperma y no lo dejan ingresar al cuello uterino y al tero. Vienen en forma de crema, gel, supositorio, espuma o comprimido. Un espermicida debe insertarse en la vagina con un aplicador al menos 10 o 15 minutos antes de tener sexo para dar tiempo a que surta efecto. El proceso debe repetirse cada vez que tenga sexo. Los espermicidas no requieren receta mdica. Anticonceptivos intrauterinos Dispositivo intrauterino (DIU) Un DIU es un dispositivo en forma de T que se coloca en el tero. Existen dos tipos: DIU hormonal.Este tipo contiene progestina, una forma sinttica de la hormona progesterona. Este tipo puede permanecer colocado durante 3 a 5 aos. DIU de cobre.Este tipo est recubierto con un alambre de cobre. Puede permanecer colocado durante 10 aos. Mtodos anticonceptivos permanentes Ligadura de trompas en la mujer En este mtodo, se sellan, atan u obstruyen las trompas de Falopio durante una ciruga para evitar que el vulo descienda hacia el tero. Esterilizacin histeroscpica En este mtodo, se coloca un implante pequeo y flexible dentro de cada trompa de Falopio. Los implantes hacen que se forme un tejido cicatricial en las trompas de Falopio y que las obstruya para que el espermatozoide no pueda llegar al vulo. El procedimiento demora alrededor de 3 meses para que sea efectivo. Debe utilizarse otro mtodo anticonceptivo durante esos 3 meses. Esterilizacin masculina Este es un procedimiento que consiste en atar los conductos que transportan el esperma (vasectoma). Luego  del procedimiento, el hombre puede eyacular lquido (semen). Debe utilizarse otro mtodo anticonceptivo durante 3 meses despus del procedimiento. Mtodos de planificacin natural Planificacin familiar natural En este mtodo, la pareja no tiene sexo durante los das en que la mujer podra quedar embarazada. Mtodo calendario En este mtodo, la mujer realiza un seguimiento de la duracin de cada ciclo menstrual, identifica los das en los que se puede producir un embarazo y no tiene sexo durante esos das. Mtodo de la ovulacin En este mtodo, la pareja evita tener sexo durante la ovulacin. Mtodo sintotrmico Este mtodo implica no tener sexo durante la ovulacin. Normalmente, la mujer comprueba la ovulacin al observar cambios en su temperatura y en la consistencia del moco cervical. Mtodo posovulacin En este mtodo, la pareja espera a que finalice la ovulacin para tener sexo. Dnde buscar ms informacin Centers for Disease Control and Prevention (Centros para el Control y la Prevencin de Enfermedades): www.cdc.gov Resumen La anticoncepcin, o los mtodos anticonceptivos, hace referencia a los mtodos o dispositivos que evitan el embarazo. Los mtodos anticonceptivos hormonales incluyen implantes, inyecciones, pastillas, parches, anillos vaginales y anticonceptivos de emergencia. Los mtodos anticonceptivos de barrera pueden incluir condones masculinos,   condones femeninos, diafragmas, capuchones cervicales, esponjas y espermicidas. Existen dos tipos de DIU (dispositivo intrauterino). Un DIU puede colocarse en el tero de una mujer para evitar el embarazo durante 3 a 5 aos. La esterilizacin permanente puede realizarse mediante un procedimiento tanto en los hombres como en las mujeres. Los mtodos de planificacin familiar natural implican no tener sexo durante los das en que la mujer podra quedar embarazada. Esta informacin no tiene como fin reemplazar el consejo del mdico.  Asegrese de hacerle al mdico cualquier pregunta que tenga. Document Revised: 12/20/2019 Document Reviewed: 12/20/2019 Elsevier Patient Education  2023 Elsevier Inc.  

## 2022-06-17 NOTE — Progress Notes (Signed)
Subjective:   Caroline [name] is a 37 y.o. [phone] at [redacted]w[redacted]d by LMP being seen today for her first obstetrical visit.  Her obstetrical history is significant for advanced maternal age and early onset GDM . Patient does intend to breast feed. Pregnancy history fully reviewed.  Patient reports no complaints.  HISTORY: OB History  Gravida Para Term Preterm AB Living  4 3 3  0 0 3  SAB IAB Ectopic Multiple Live Births  0 0 0 0 3    # Outcome Date GA Lbr Len/2nd Weight Sex Delivery Anes PTL Lv  4 Current           3 Term 07/31/14 [redacted]w[redacted]d 03:54 / 00:03 7 lb 8.5 oz (3.416 kg) M Vag-Spont None  LIV     Birth Comments: WML     Apgar1: 9  Apgar5: 9  2 Term 11/26/11 [redacted]w[redacted]d 02:50 / 01:00 8 lb 6 oz (3.799 kg) M Vag-Spont None  LIV     Birth Comments: none     Name: SANCHEZ-MARTINEZ,BOY [place]     Apgar1: 8  Apgar5: 10  1 Term 2009 [redacted]w[redacted]d  8 lb (3.629 kg) M Vag-Spont EPI  LIV     Last pap smear: No results found for: "DIAGPAP", "HPV", "[place]" 10/31/20 - NILM per [facility] records  Past Medical History:  Diagnosis Date   Umbilical hernia 3244   Past Surgical History:  Procedure Laterality Date   CHOLECYSTECTOMY N/A 07/14/2016   Procedure: LAPAROSCOPIC CHOLECYSTECTOMY;  Surgeon: Georganna Skeans, MD;  Location: [facility];  Service: General;  Laterality: N/A;   NO PAST SURGERIES     Family History  Problem Relation Age of Onset   Hypertension Father    Social History   Tobacco Use   Smoking status: Never   Smokeless tobacco: Never  Substance Use Topics   Alcohol use: No   Drug use: No   No Known Allergies Current Outpatient Medications on File Prior to Visit  Medication Sig Dispense Refill   Prenatal Vit-Fe Fumarate-FA (MULTIVITAMIN-PRENATAL) 27-0.8 MG TABS tablet Take 1 tablet by mouth daily at 12 noon.     No current facility-administered medications on file prior to visit.     Exam   Vitals:   06/17/22 1003  BP: 109/72  Pulse: 90  Weight: 177 lb 4.8 oz (80.4 kg)   Height: 5\' 4"  (1.626 m)   Fetal Heart Rate (bpm): 153  System: General: well-developed, well-nourished female in no acute distress   Skin: normal coloration and turgor, no rashes   Neurologic: oriented, normal, negative, normal mood   Extremities: normal strength, tone, and muscle mass, ROM of all joints is normal   HEENT PERRLA, extraocular movement intact and sclera clear, anicteric   Neck supple and no masses   Respiratory:  no respiratory distress      Assessment:   Pregnancy: [id number] Patient Active Problem List   Diagnosis Date Noted   Supervision of high risk pregnancy, antepartum 06/17/2022   History of cholecystectomy 06/17/2022   Gestational diabetes mellitus (GDM), antepartum 06/05/2022   Acute cholecystitis due to biliary calculus 07/14/2016   Elevated liver enzymes 36/64/4034   Umbilical hernia 74/25/9563   Dental decay 08/23/2015     Plan:  1. Supervision of high risk pregnancy, antepartum BP and FHR normal Labs reviewed, up to date and all normal but will get CMP/UPCR today for baseline given GDM Problem list reviewed and updated. Does not yet have anatomy scan scheduled, will send to MFM  as [facility] has no availability currently and she is already 21 weeks The nature of [facility] with multiple MDs and other Advanced Practice Providers was explained to patient; also emphasized that residents, students are part of our team.  2. History of cholecystectomy 2018 with abnormal LFTs  3. Umbilical hernia without obstruction and without gangrene   4. Diet controlled gestational diabetes mellitus (GDM), antepartum Not really checking sugars Has already seen Angela on 06/05/2022 Emphasized importance of checking sugars QID and good glucose control for optimal pregnancy outcomes Check CMP/UPCR for baseline Start baby ASA No [place] available at [facility] and has not yet had an anatomy scan Will send to MFM unfortunately due to lack  of other options  Routine obstetric precautions reviewed. Return in 4 weeks (on 07/15/2022) for [facility], ob visit, needs MD.

## 2022-06-18 LAB — COMPREHENSIVE METABOLIC PANEL
ALT: 18 IU/L (ref 0–32)
AST: 18 IU/L (ref 0–40)
Albumin/Globulin Ratio: 1.7 (ref 1.2–2.2)
Albumin: 3.8 g/dL — ABNORMAL LOW (ref 3.9–4.9)
Alkaline Phosphatase: 49 IU/L (ref 44–121)
BUN/Creatinine Ratio: 12 (ref 9–23)
BUN: 6 mg/dL (ref 6–20)
Bilirubin Total: <0.2 mg/dL (ref 0.0–1.2)
CO2: 20 mmol/L (ref 20–29)
Calcium: 8.6 mg/dL — ABNORMAL LOW (ref 8.7–10.2)
Chloride: 103 mmol/L (ref 96–106)
Creatinine, Ser: 0.49 mg/dL — ABNORMAL LOW (ref 0.57–1.00)
Globulin, Total: 2.2 g/dL (ref 1.5–4.5)
Glucose: 96 mg/dL (ref 70–99)
Potassium: 3.8 mmol/L (ref 3.5–5.2)
Sodium: 138 mmol/L (ref 134–144)
Total Protein: 6 g/dL (ref 6.0–8.5)
eGFR: 125 mL/min/{1.73_m2} (ref >59)

## 2022-06-18 LAB — PROTEIN / CREATININE RATIO, URINE
Creatinine, Urine: 142.4 mg/dL
Protein, Ur: 16.9 mg/dL
Protein/Creat Ratio: 119 mg/g creat (ref 0–200)

## 2022-06-19 ENCOUNTER — Encounter: Payer: Self-pay | Admitting: General Practice

## 2022-06-23 ENCOUNTER — Encounter: Payer: Self-pay | Admitting: *Deleted

## 2022-07-01 ENCOUNTER — Encounter: Payer: Self-pay | Admitting: Family Medicine

## 2022-07-01 ENCOUNTER — Ambulatory Visit (INDEPENDENT_AMBULATORY_CARE_PROVIDER_SITE_OTHER): Payer: Self-pay | Admitting: Family Medicine

## 2022-07-01 VITALS — BP 114/65 | HR 85 | Wt 180.7 lb

## 2022-07-01 DIAGNOSIS — K429 Umbilical hernia without obstruction or gangrene: Secondary | ICD-10-CM

## 2022-07-01 DIAGNOSIS — O24410 Gestational diabetes mellitus in pregnancy, diet controlled: Secondary | ICD-10-CM

## 2022-07-01 DIAGNOSIS — Z3A23 23 weeks gestation of pregnancy: Secondary | ICD-10-CM

## 2022-07-01 DIAGNOSIS — O0990 Supervision of high risk pregnancy, unspecified, unspecified trimester: Secondary | ICD-10-CM

## 2022-07-01 DIAGNOSIS — R748 Abnormal levels of other serum enzymes: Secondary | ICD-10-CM

## 2022-07-01 DIAGNOSIS — O0992 Supervision of high risk pregnancy, unspecified, second trimester: Secondary | ICD-10-CM

## 2022-07-01 LAB — GLUCOSE, CAPILLARY: Glucose-Capillary: 196 mg/dL — ABNORMAL HIGH (ref 70–99)

## 2022-07-01 MED ORDER — METFORMIN HCL 500 MG PO TABS: 500.0000 mg | ORAL_TABLET | Freq: Two times a day (BID) | ORAL | 5 refills | Status: DC

## 2022-07-01 NOTE — Progress Notes (Signed)
   Subjective:  Caroline [name] is a 37 y.o. [phone] at [redacted]w[redacted]d being seen today for ongoing prenatal care.  She is currently monitored for the following issues for this high-risk pregnancy and has Dental decay; Umbilical hernia; Elevated liver enzymes; Acute cholecystitis due to biliary calculus; Gestational diabetes mellitus (GDM), antepartum; Supervision of high risk pregnancy, antepartum; and History of cholecystectomy on their problem list.  Patient reports no complaints.  Contractions: Not present. Vag. Bleeding: None.  Movement: Present. Denies leaking of fluid.   The following portions of the patient's history were reviewed and updated as appropriate: allergies, current medications, past family history, past medical history, past social history, past surgical history and problem list. Problem list updated.  Objective:   Vitals:   07/01/22 1415  BP: 114/65  Pulse: 85  Weight: 180 lb 11.2 oz (82 kg)    Fetal Status: Fetal Heart Rate (bpm): 160   Movement: Present     General:  Alert, oriented and cooperative. Patient is in no acute distress.  Skin: Skin is warm and dry. No rash noted.   Cardiovascular: Normal heart rate noted  Respiratory: Normal respiratory effort, no problems with respiration noted  Abdomen: Soft, gravid, appropriate for gestational age. Pain/Pressure: Absent     Pelvic: Vag. Bleeding: None     Cervical exam deferred        Extremities: Normal range of motion.     Mental Status: Normal mood and affect. Normal behavior. Normal judgment and thought content.   Urinalysis:      Assessment and Plan:  Pregnancy: G4P3003 at [redacted]w[redacted]d  1. Supervision of high risk pregnancy, antepartum BP and FHR normal  2. Diet controlled gestational diabetes mellitus (GDM), antepartum Did not bring log On recall fastings are 96-105, post prandials 120-130's CBG in office is 196, patient reports coffee at 12 and eating an orange at 1300 (CBG taken around 1445) Discussed  sugars appear to be out of range, start metformin 500 mg BID Reinforced dietary changes Has anatomy scan scheduled for 07/08/2022 Taking ASA  3. Umbilical hernia without obstruction and without gangrene   4. Elevated liver enzymes In 2018 during episodes of acute cholecystitis>had cholecystectomy  Preterm labor symptoms and general obstetric precautions including but not limited to vaginal bleeding, contractions, leaking of fluid and fetal movement were reviewed in detail with the patient. Please refer to After Visit Summary for other counseling recommendations.  Return in 2 weeks (on 07/15/2022) for [facility], ob visit, needs MD.   Clarnce Flock, MD

## 2022-07-01 NOTE — Patient Instructions (Signed)
Eleccin del mtodo anticonceptivo Contraception Choices La anticoncepcin, o los mtodos anticonceptivos, hace referencia a los mtodos o dispositivos que evitan el embarazo. Mtodos hormonales  Implante anticonceptivo Un implante anticonceptivo consiste en un tubo delgado de plstico que contiene una hormona que evita el embarazo. Es diferente de un dispositivo intrauterino (DIU). Un mdico lo inserta en la parte superior del brazo. Los implantes pueden ser eficaces durante un mximo de 3 aos. Inyecciones de progestina sola Las inyecciones de progestina sola contienen progestina, una forma sinttica de la hormona progesterona. Un mdico las administra cada 3 meses. Pldoras anticonceptivas Las pldoras anticonceptivas son pastillas que contienen hormonas que evitan el embarazo. Deben tomarse una vez al da, preferentemente a la misma hora cada da. Se necesita una receta para utilizar este mtodo anticonceptivo. Parche anticonceptivo El parche anticonceptivo contiene hormonas que evitan el embarazo. Se coloca en la piel, debe cambiarse una vez a la semana durante tres semanas y debe retirarse en la cuarta semana. Se necesita una receta para utilizar este mtodo anticonceptivo. Anillo vaginal Un anillo vaginal contiene hormonas que evitan el embarazo. Se coloca en la vagina durante tres semanas y se retira en la cuarta semana. Luego se repite el proceso con un anillo nuevo. Se necesita una receta para utilizar este mtodo anticonceptivo. Anticonceptivo de emergencia Los anticonceptivos de emergencia son mtodos para evitar un embarazo despus de tener sexo sin proteccin. Vienen en forma de pldora y pueden tomarse hasta 5 das despus de tener sexo. Funcionan mejor cuando se toman lo ms pronto posible luego de tener sexo. La mayora de los anticonceptivos de emergencia estn disponibles sin receta mdica. Este mtodo no debe utilizarse como el nico mtodo anticonceptivo. Mtodos de  barrera  Condn masculino Un condn masculino es una vaina delgada que se coloca sobre el pene durante el sexo. Los condones evitan que el esperma ingrese en el cuerpo de la mujer. Pueden utilizarse con un una sustancia que mata a los espermatozoides (espermicida) para aumentar la efectividad. Deben desecharse despus de un uso. Condn femenino Un condn femenino es una vaina blanda y holgada que se coloca en la vagina antes de tener sexo. El condn evita que el esperma ingrese en el cuerpo de la mujer. Deben desecharse despus de un uso. Diafragma Un diafragma es una barrera blanda con forma de cpula. Se inserta en la vagina antes del sexo, junto con un espermicida. El diafragma bloquea el ingreso de esperma en el tero, y el espermicida mata a los espermatozoides. El diafragma debe permanecer en la vagina durante 6 a 8 horas despus de tener sexo y debe retirarse en el plazo de las 24 horas. Un diafragma es recetado y colocado por un mdico. Debe reemplazarse cada 1 a 2 aos, despus de dar a luz, de aumentar ms de 15lb (6.8kg) y de una ciruga plvica. Capuchn cervical Un capuchn cervical es una copa redonda y blanda de ltex o plstico que se coloca en el cuello uterino. Se inserta en la vagina antes del sexo, junto con un espermicida. Bloquea el ingreso del esperma en el tero. El capuchn debe permanecer en el lugar durante 6 a 8 horas despus de tener sexo y debe retirarse en el plazo de las 48 horas. Un capuchn cervical debe ser recetado y colocado por un mdico. Debe reemplazarse cada 2aos. Esponja Una esponja es una pieza blanda y circular de espuma de poliuretano que contiene espermicida. La esponja ayuda a bloquear el ingreso de esperma en el tero, y el espermicida   mata a los espermatozoides. Para utilizarla, debe humedecerla e insertarla en la vagina. Debe insertarse antes de tener sexo, debe permanecer dentro al menos durante 6 horas despus de tener sexo y debe retirarse y  desecharse en el plazo de las 30 horas. Espermicidas Los espermicidas son sustancias qumicas que matan o bloquean al esperma y no lo dejan ingresar al cuello uterino y al tero. Vienen en forma de crema, gel, supositorio, espuma o comprimido. Un espermicida debe insertarse en la vagina con un aplicador al menos 10 o 15 minutos antes de tener sexo para dar tiempo a que surta efecto. El proceso debe repetirse cada vez que tenga sexo. Los espermicidas no requieren receta mdica. Anticonceptivos intrauterinos Dispositivo intrauterino (DIU) Un DIU es un dispositivo en forma de T que se coloca en el tero. Existen dos tipos: DIU hormonal.Este tipo contiene progestina, una forma sinttica de la hormona progesterona. Este tipo puede permanecer colocado durante 3 a 5 aos. DIU de cobre.Este tipo est recubierto con un alambre de cobre. Puede permanecer colocado durante 10 aos. Mtodos anticonceptivos permanentes Ligadura de trompas en la mujer En este mtodo, se sellan, atan u obstruyen las trompas de Falop[place] durante una ciruga para evitar que el vulo descienda hacia el tero. Esterilizacin histeroscpica En este mtodo, se coloca un implante pequeo y flexible dentro de cada trompa de Falopio. Los implantes hacen que se forme un tejido cicatricial en las trompas de Falopio y que las obstruya para que el espermatozoide no pueda llegar al vulo. El procedimiento demora alrededor de 3 meses para que sea efectivo. Debe utilizarse otro mtodo anticonceptivo durante esos 3 meses. Esterilizacin masculina Este es un procedimiento que consiste en atar los conductos que transportan el esperma (vasectoma). Luego del procedimiento, el hombre puede eyacular lquido (semen). Debe utilizarse otro mtodo anticonceptivo durante 3 meses despus del procedimiento. Mtodos de planificacin natural Planificacin familiar natural En este mtodo, la pareja no tiene sexo durante los das en que la mujer podra quedar  embarazada. Mtodo calendario En este mtodo, la mujer realiza un seguimiento de la duracin de cada ciclo menstrual, identifica los das en los que se puede producir un embarazo y no tiene sexo durante esos das. Mtodo de la ovulacin En este mtodo, la pareja evita tener sexo durante la ovulacin. Mtodo sintotrmico Este mtodo implica no tener sexo durante la ovulacin. Normalmente, la mujer comprueba la ovulacin al observar cambios en su temperatura y en la consistencia del moco cervical. Mtodo posovulacin En este mtodo, la pareja espera a que finalice la ovulacin para tener sexo. Dnde buscar ms informacin Centers for Disease Control and Prevention (Centros para el Control y la Prevencin de Enfermedades): www.cdc.gov Resumen La anticoncepcin, o los mtodos anticonceptivos, hace referencia a los mtodos o dispositivos que evitan el embarazo. Los mtodos anticonceptivos hormonales incluyen implantes, inyecciones, pastillas, parches, anillos vaginales y anticonceptivos de emergencia. Los mtodos anticonceptivos de barrera pueden incluir condones masculinos, condones femeninos, diafragmas, capuchones cervicales, esponjas y espermicidas. Existen dos tipos de DIU (dispositivo intrauterino). Un DIU puede colocarse en el tero de una mujer para evitar el embarazo durante 3 a 5 aos. La esterilizacin permanente puede realizarse mediante un procedimiento tanto en los hombres como en las mujeres. Los mtodos de planificacin familiar natural implican no tener sexo durante los das en que la mujer podra quedar embarazada. Esta informacin no tiene como fin reemplazar el consejo del mdico. Asegrese de hacerle al mdico cualquier pregunta que tenga. Document Revised: 12/20/2019 Document Reviewed: 12/20/2019 Elsevier Patient Education    Caroline [name].

## 2022-07-08 ENCOUNTER — Ambulatory Visit: Payer: Self-pay | Admitting: *Deleted

## 2022-07-08 ENCOUNTER — Encounter: Payer: Self-pay | Admitting: *Deleted

## 2022-07-08 ENCOUNTER — Other Ambulatory Visit: Payer: Self-pay | Admitting: *Deleted

## 2022-07-08 ENCOUNTER — Encounter: Payer: Self-pay | Admitting: Family Medicine

## 2022-07-08 ENCOUNTER — Ambulatory Visit: Payer: Self-pay | Attending: Family Medicine

## 2022-07-08 VITALS — BP 111/58 | HR 76

## 2022-07-08 DIAGNOSIS — O409XX0 Polyhydramnios, unspecified trimester, not applicable or unspecified: Secondary | ICD-10-CM | POA: Insufficient documentation

## 2022-07-08 DIAGNOSIS — O403XX0 Polyhydramnios, third trimester, not applicable or unspecified: Secondary | ICD-10-CM

## 2022-07-08 DIAGNOSIS — O09523 Supervision of elderly multigravida, third trimester: Secondary | ICD-10-CM

## 2022-07-08 DIAGNOSIS — O3660X0 Maternal care for excessive fetal growth, unspecified trimester, not applicable or unspecified: Secondary | ICD-10-CM | POA: Insufficient documentation

## 2022-07-08 DIAGNOSIS — O24410 Gestational diabetes mellitus in pregnancy, diet controlled: Secondary | ICD-10-CM

## 2022-07-08 DIAGNOSIS — O0990 Supervision of high risk pregnancy, unspecified, unspecified trimester: Secondary | ICD-10-CM

## 2022-07-08 NOTE — Progress Notes (Signed)
Patient unaware of GDM and order for Metformin.

## 2022-07-16 ENCOUNTER — Encounter: Payer: Self-pay | Admitting: Obstetrics & Gynecology

## 2022-07-16 NOTE — Progress Notes (Deleted)
   PRENATAL VISIT NOTE  Subjective:  Caroline [name] is a 37 y.o. [phone] at 101w5d being seen today for ongoing prenatal care.  She is currently monitored for the following issues for this high-risk pregnancy and has Dental decay; Umbilical hernia; Gestational diabetes mellitus (GDM), antepartum; Supervision of high risk pregnancy, antepartum; LGA (large for gestational age) fetus affecting management of mother; and Polyhydramnios on their problem list.  Patient reports {sx:14538}.   .  .   . Denies leaking of fluid.   The following portions of the patient's history were reviewed and updated as appropriate: allergies, current medications, past family history, past medical history, past social history, past surgical history and problem list.   Objective:  There were no vitals filed for this visit.  Fetal Status:           General:  Alert, oriented and cooperative. Patient is in no acute distress.  Skin: Skin is warm and dry. No rash noted.   Cardiovascular: Normal heart rate noted  Respiratory: Normal respiratory effort, no problems with respiration noted  Abdomen: Soft, gravid, appropriate for gestational age.        Pelvic: Cervical exam deferred        Extremities: Normal range of motion.     Mental Status: Normal mood and affect. Normal behavior. Normal judgment and thought content.   Assessment and Plan:  Pregnancy: G4P3003 at [redacted]w[redacted]d 1. Diet controlled gestational diabetes mellitus (GDM), antepartum Current regimen: MTF 500 bid CBG review: *** Regimen changes: {Blank multiple:19196::"***","none"} Growth [place]: *** Antenatal monitoring: Begin antenatal testing at 32 weeks Other needs: None   2. Excessive fetal growth affecting management of pregnancy in second trimester, single or unspecified fetus 2/6 was 98%ile with AC 98%ile. Poly noted. Has f/u on 3/8.   3. Polyhydramnios in second trimester, fetus 1 of multiple gestation MVP was 9. Continue to follow.   4.  Supervision of high risk pregnancy, antepartum Routine 28 week labs next visit.  Tdap next time.  Rh positive, no indication for rhogam Spanish interpreter used throughout the appointment.   Preterm labor symptoms and general obstetric precautions including but not limited to vaginal bleeding, contractions, leaking of fluid and fetal movement were reviewed in detail with the patient. Please refer to After Visit Summary for other counseling recommendations.   No follow-ups on file.  Future Appointments  Date Time Provider [place]  07/18/2022 10:55 AM Radene Gunning, MD [facility]  08/08/2022  3:30 PM [facility] NURSE [facility]  08/08/2022  3:45 PM [facility] US1 [facility]  09/05/2022  3:15 PM [facility] NURSE [facility]  09/05/2022  3:30 PM [facility] US3 [facility]  09/12/2022  3:15 PM [facility] NURSE [facility]  09/12/2022  3:30 PM [facility] US3 [facility]  09/19/2022  3:15 PM [facility] NURSE [facility]  09/19/2022  3:30 PM [facility] US2 [facility] [place]    Radene Gunning, MD

## 2022-07-18 ENCOUNTER — Telehealth: Payer: Self-pay | Admitting: Family Medicine

## 2022-07-18 ENCOUNTER — Encounter: Payer: Self-pay | Admitting: Obstetrics and Gynecology

## 2022-07-18 DIAGNOSIS — O24410 Gestational diabetes mellitus in pregnancy, diet controlled: Secondary | ICD-10-CM

## 2022-07-18 DIAGNOSIS — O0990 Supervision of high risk pregnancy, unspecified, unspecified trimester: Secondary | ICD-10-CM

## 2022-07-18 DIAGNOSIS — O402XX1 Polyhydramnios, second trimester, fetus 1: Secondary | ICD-10-CM

## 2022-07-18 DIAGNOSIS — O3662X0 Maternal care for excessive fetal growth, second trimester, not applicable or unspecified: Secondary | ICD-10-CM

## 2022-07-18 NOTE — Telephone Encounter (Signed)
Called patient with the help of interpreter Rosemarie Ax to reschedule her appointment, there was no answer to the phone call so a voicemail was left to call the office back.

## 2022-08-02 ENCOUNTER — Encounter (HOSPITAL_COMMUNITY): Payer: Self-pay | Admitting: *Deleted

## 2022-08-02 ENCOUNTER — Emergency Department (HOSPITAL_COMMUNITY)
Admission: EM | Admit: 2022-08-02 | Discharge: 2022-08-02 | Disposition: A | Discharge status: Home / Self Care | Payer: Self-pay | Attending: Emergency Medicine | Admitting: Emergency Medicine

## 2022-08-02 ENCOUNTER — Other Ambulatory Visit: Payer: Self-pay

## 2022-08-02 ENCOUNTER — Emergency Department (HOSPITAL_COMMUNITY): Payer: Self-pay

## 2022-08-02 DIAGNOSIS — Z1152 Encounter for screening for COVID-19: Secondary | ICD-10-CM | POA: Insufficient documentation

## 2022-08-02 DIAGNOSIS — J101 Influenza due to other identified influenza virus with other respiratory manifestations: Secondary | ICD-10-CM | POA: Insufficient documentation

## 2022-08-02 DIAGNOSIS — Z7984 Long term (current) use of oral hypoglycemic drugs: Secondary | ICD-10-CM | POA: Insufficient documentation

## 2022-08-02 DIAGNOSIS — Z3A25 25 weeks gestation of pregnancy: Secondary | ICD-10-CM | POA: Insufficient documentation

## 2022-08-02 DIAGNOSIS — Z7982 Long term (current) use of aspirin: Secondary | ICD-10-CM | POA: Insufficient documentation

## 2022-08-02 DIAGNOSIS — O98512 Other viral diseases complicating pregnancy, second trimester: Secondary | ICD-10-CM | POA: Insufficient documentation

## 2022-08-02 DIAGNOSIS — O24419 Gestational diabetes mellitus in pregnancy, unspecified control: Secondary | ICD-10-CM | POA: Insufficient documentation

## 2022-08-02 LAB — COMPREHENSIVE METABOLIC PANEL
ALT: 24 U/L (ref 0–44)
AST: 30 U/L (ref 15–41)
Albumin: 2.5 g/dL — ABNORMAL LOW (ref 3.5–5.0)
Alkaline Phosphatase: 58 U/L (ref 38–126)
Anion gap: 10 (ref 5–15)
BUN: 6 mg/dL (ref 6–20)
CO2: 24 mmol/L (ref 22–32)
Calcium: 8.4 mg/dL — ABNORMAL LOW (ref 8.9–10.3)
Chloride: 102 mmol/L (ref 98–111)
Creatinine, Ser: 0.56 mg/dL (ref 0.44–1.00)
GFR, Estimated: >60 mL/min (ref >60)
Glucose, Bld: 203 mg/dL — ABNORMAL HIGH (ref 70–99)
Potassium: 3.2 mmol/L — ABNORMAL LOW (ref 3.5–5.1)
Sodium: 136 mmol/L (ref 135–145)
Total Bilirubin: 0.4 mg/dL (ref 0.3–1.2)
Total Protein: 5.6 g/dL — ABNORMAL LOW (ref 6.5–8.1)

## 2022-08-02 LAB — CBC WITH DIFFERENTIAL/PLATELET
Abs Immature Granulocytes: 0.01 10*3/uL (ref 0.00–0.07)
Basophils Absolute: 0 10*3/uL (ref 0.0–0.1)
Basophils Relative: 0 %
Eosinophils Absolute: 0 10*3/uL (ref 0.0–0.5)
Eosinophils Relative: 1 %
HCT: 38.1 % (ref 36.0–46.0)
Hemoglobin: 12.4 g/dL (ref 12.0–15.0)
Immature Granulocytes: 0 %
Lymphocytes Relative: 15 %
Lymphs Abs: 0.6 10*3/uL — ABNORMAL LOW (ref 0.7–4.0)
MCH: 31.1 pg (ref 26.0–34.0)
MCHC: 32.5 g/dL (ref 30.0–36.0)
MCV: 95.5 fL (ref 80.0–100.0)
Monocytes Absolute: 0.2 10*3/uL (ref 0.1–1.0)
Monocytes Relative: 5 %
Neutro Abs: 3.3 10*3/uL (ref 1.7–7.7)
Neutrophils Relative %: 79 %
Platelets: 125 10*3/uL — ABNORMAL LOW (ref 150–400)
RBC: 3.99 MIL/uL (ref 3.87–5.11)
RDW: 13.6 % (ref 11.5–15.5)
WBC: 4.2 10*3/uL (ref 4.0–10.5)
nRBC: 0 % (ref 0.0–0.2)

## 2022-08-02 LAB — RESP PANEL BY RT-PCR (RSV, FLU A&B, COVID)  RVPGX2
Influenza A by PCR: NEGATIVE
Influenza B by PCR: POSITIVE — AB
Resp Syncytial Virus by PCR: NEGATIVE
SARS Coronavirus 2 by RT PCR: NEGATIVE

## 2022-08-02 LAB — URINALYSIS, ROUTINE W REFLEX MICROSCOPIC
Bilirubin Urine: NEGATIVE
Glucose, UA: >=500 mg/dL — AB
Hgb urine dipstick: NEGATIVE
Ketones, ur: 5 mg/dL — AB
Leukocytes,Ua: NEGATIVE
Nitrite: NEGATIVE
Protein, ur: NEGATIVE mg/dL
Specific Gravity, Urine: 1.023 (ref 1.005–1.030)
pH: 5 (ref 5.0–8.0)

## 2022-08-02 LAB — LACTIC ACID, PLASMA
Lactic Acid, Venous: 2.6 mmol/L (ref 0.5–1.9)
Lactic Acid, Venous: 1.9 mmol/L (ref 0.5–1.9)

## 2022-08-02 MED ORDER — LACTATED RINGERS IV BOLUS
1000.0000 mL | Freq: Once | INTRAVENOUS | Status: AC
  Administered 2022-08-02: 1000 mL via INTRAVENOUS

## 2022-08-02 MED ORDER — ACETAMINOPHEN 325 MG PO TABS
650.0000 mg | ORAL_TABLET | Freq: Once | ORAL | Status: AC
  Administered 2022-08-02: 650 mg via ORAL
  Filled 2022-08-02: qty 2

## 2022-08-02 NOTE — Discharge Instructions (Addendum)
As we discussed, you tested positive for flu B today.  As this is a viral illness, no antibiotics are indicated. I recommend that you get plenty of rest and focus on symptomatic relief with tylenol for headaches and fevers. I also recommend:  Increased fluid intake. Sports drinks offer valuable electrolytes, sugars, and fluids.  Breathing heated mist or steam (vaporizer or shower).  Eating chicken soup or other clear broths, and maintaining good nutrition.    Return to work when your temperature has returned to normal.  Gargle warm salt water and spit it out for sore throat.   Follow Up: Follow up with your primary care doctor in 5-7 days for recheck of ongoing symptoms.  Return to emergency department for emergent changing or worsening of symptoms.  Como comentamos, hoy dio positivo por gripe B. Al tratarse de una enfermedad viral, no estn indicados antibiticos. Le recomiendo que descanse lo suficiente y se concentre en el alivio sintomtico con tylenol para los dolores de [place] y [place]. Tambin recomiendo:   Aumento de la ingesta de lquidos. Las bebidas deportivas ofrecen valiosos electrolitos, azcares y lquidos.  Respirar niebla o vapor caliente (vaporizador o ducha).  Comer sopa de pollo u otros caldos claros y [employer] buena nutricin.  Regrese al Mat Carne cuando su temperatura haya vuelto a la normalidad.  Haga grgaras con agua tibia con sal y escpala para el dolor de garganta.  Seguimiento: haga un seguimiento con su mdico de atencin primaria en 5 a 7 das para volver a Conservator, museum/gallery. Regrese al departamento de emergencias si los sntomas [place] o empeoran de Social worker.

## 2022-08-02 NOTE — ED Notes (Signed)
Interpreter used [phone] to explain iv placement lab testing and medication administration

## 2022-08-02 NOTE — ED Triage Notes (Signed)
C/o cough and headache , when coughing c/o pain in right side. Onset Thurs. States she is suppose to deliver 05/20 denies abd. Pain

## 2022-08-02 NOTE — ED Provider Notes (Signed)
[place] Provider Note   CSN: [id number] Arrival date & time: 08/02/22  1015     History  Chief Complaint  Patient presents with   Headache    Caroline [name] is a 37 y.o. female.  Patient currently [redacted] weeks pregnant with gestational diabetes with history of cholecystectomy presents today with complaints of headache, cough, and right sided abdominal pain. She states that her cough began on Thursday with associated congestion.  States that she has been coughing constantly since then and after several days of coughing developed RUQ abdominal pain that is only present when she coughs. She states she has been nauseous and vomiting throughout her pregnancy, denies any increase in this since symptoms began.  She has been followed with OB/GYN and has no other pregnancy complications except gestational diabetes.  States that she was started on a medication for gestational diabetes but decided not to take it because she was not sure of the efficacy of this medication in pregnancy.  She does not check her sugars at home.  Also endorses headache that is in the front of her head and does not radiate.  She denies any vision changes or neck pain.  No chest pain or shortness of breath.  No vaginal bleeding or discharge.  She has not tried any medications for symptoms.  No known sick contacts. She is G4P3  The history is provided by the patient. A language interpreter was used.  Headache Associated symptoms: congestion and cough        Home Medications Prior to Admission medications   Medication Sig Start Date End Date Taking? Authorizing Provider  aspirin EC 81 MG tablet Take 1 tablet (81 mg total) by mouth daily. Swallow whole. 06/17/22   Clarnce Flock, MD  metFORMIN (GLUCOPHAGE) 500 MG tablet Take 1 tablet (500 mg total) by mouth 2 (two) times daily with a meal. Patient not taking: Reported on 07/08/2022 07/01/22   Clarnce Flock, MD   ondansetron (ZOFRAN-ODT) 4 MG disintegrating tablet Take 1 tablet (4 mg total) by mouth every 6 (six) hours as needed for nausea. 06/17/22   Clarnce Flock, MD  Prenatal Vit-Fe Fumarate-FA (MULTIVITAMIN-PRENATAL) 27-0.8 MG TABS tablet Take 1 tablet by mouth daily at 12 noon.    [provider]      Allergies    Patient has no known allergies.    Review of Systems   Review of Systems  HENT:  Positive for congestion.   Respiratory:  Positive for cough.   Neurological:  Positive for headaches.  All other systems reviewed and are negative.   Physical Exam Updated Vital Signs BP (!) 106/56   Pulse 100   Temp 99.6 F (37.6 C) (Oral)   Wt 79.4 kg   LMP 01/19/2022   SpO2 97%   BMI 30.04 kg/m  Physical Exam Vitals and nursing note reviewed.  Constitutional:      General: She is not in acute distress.    Appearance: Normal appearance. She is normal weight. She is not ill-appearing, toxic-appearing or diaphoretic.  HENT:     Head: Normocephalic and atraumatic.     Mouth/Throat:     Mouth: Mucous membranes are moist.  Eyes:     Extraocular Movements: Extraocular movements intact.     Pupils: Pupils are equal, round, and reactive to light.  Neck:     Comments: No meningismus Cardiovascular:     Rate and Rhythm: Normal rate and regular rhythm.  Heart sounds: Normal heart sounds.  Pulmonary:     Effort: Pulmonary effort is normal. No respiratory distress.     Breath sounds: Normal breath sounds.  Abdominal:     Comments: Gravid abdomen, no palpable abdominal tenderness.  Musculoskeletal:        General: Normal range of motion.     Cervical back: Normal range of motion and neck supple.  Skin:    General: Skin is warm and dry.  Neurological:     General: No focal deficit present.     Mental Status: She is alert and oriented to person, place, and time.     GCS: GCS eye subscore is 4. GCS verbal subscore is 5. GCS motor subscore is 6.     Cranial Nerves: No  cranial nerve deficit.  Psychiatric:        Mood and Affect: Mood normal.        Behavior: Behavior normal.     ED Results / Procedures / Treatments   Labs (all labs ordered are listed, but only abnormal results are displayed) Labs Reviewed  COMPREHENSIVE METABOLIC PANEL - Abnormal; Notable for the following components:      Result Value   Potassium 3.2 (*)    Glucose, Bld 203 (*)    Calcium 8.4 (*)    Total Protein 5.6 (*)    Albumin 2.5 (*)    All other components within normal limits  URINALYSIS, ROUTINE W REFLEX MICROSCOPIC - Abnormal; Notable for the following components:   APPearance HAZY (*)    Glucose, UA >=500 (*)    Ketones, ur 5 (*)    Bacteria, UA RARE (*)    All other components within normal limits  RESP PANEL BY RT-PCR (RSV, FLU A&B, COVID)  RVPGX2  LACTIC ACID, PLASMA  LACTIC ACID, PLASMA  CBC WITH DIFFERENTIAL/PLATELET  CBG MONITORING, ED    EKG None  Radiology DG Chest 2 View  Result Date: 08/02/2022 CLINICAL DATA:  Cough. Right-sided pain when coughing. Pregnant patient. EXAM: CHEST - 2 VIEW COMPARISON:  None Available. FINDINGS: Lung volumes are low.The cardiomediastinal contours are normal for technique. Mild bronchial thickening versus bronchovascular crowding related to low lung volumes. Pulmonary vasculature is normal. No consolidation, pleural effusion, or pneumothorax. No acute osseous abnormalities are seen. IMPRESSION: Low lung volumes with bronchial thickening versus bronchovascular crowding. Electronically Signed   By: Keith Rake M.D.   On: 08/02/2022 11:14    Procedures Procedures    Medications Ordered in ED Medications  acetaminophen (TYLENOL) tablet 650 mg (has no administration in time range)    ED Course/ Medical Decision Making/ A&P                             Medical Decision Making Amount and/or Complexity of Data Reviewed Labs: ordered. Radiology: ordered.  Risk OTC drugs.   This patient is a 37 y.o. female who  presents to the ED for concern of headache, cough, abdominal pain, this involves an extensive number of treatment options, and is a complaint that carries with it a high risk of complications and morbidity. The emergent differential diagnosis prior to evaluation includes, but is not limited to,  URI, HELLP, preecclampsia, DKA/HHS . This is not an exhaustive differential.   Past Medical History / Co-morbidities / Social History: History of cholecystectomy in 2018, Spanish-speaking only, G4, P3  Additional history: Chart reviewed. Pertinent results include: Patient seen by OB/GYN and diagnosed with gestational  diabetes started on metformin and given lancets, strips, and a glucometer  Physical Exam: Physical exam performed. The pertinent findings include: Gravid abdomen is nontender.  Lung sounds clear to auscultation in all fields.  Alert and oriented and neurologically intact without focal deficits.   Lab Tests: I ordered, and personally interpreted labs.  The pertinent results include: No leukocytosis or anemia, platelets 125. K3.2, glucose 203, liver enzymes normal, anion gap and bicarb within normal limits.  Flu B+.  Lactic acid ordered in triage prior to my evaluation and first reading is 2.6, repeat after fluids 1.9. UA shows bacteriuria but no WBCs, culture pending   Imaging Studies: I ordered imaging studies including CXR. I independently visualized and interpreted imaging which showed   Low lung volumes with bronchial thickening versus bronchovascular crowding.  I agree with the radiologist interpretation.   Medications: I ordered medication including fluids and tylenol  for dehydration and headache. Reevaluation of the patient after these medicines showed that the patient improved. I have reviewed the patients home medicines and have made adjustments as needed.    Disposition:  Patient [redacted] weeks gestation presents today with complaints of cough, congestion, headache since Thursday.  She is afebrile, non-toxic appearing, and in no acute distress with reassuring vital signs.  She did note some right upper quadrant abdominal pain, however her liver enzymes are unremarkable and she has had a cholecystectomy previously.  Her right upper quadrant is soft and nontender.  The patient for further evaluation of this pain.  Suspect that it is muscle soreness from coughing.  She does have flu B symptoms are consistent with same.  She did have a mildly elevated lactic which is likely due to dehydration given her elevated blood sugars and nausea and vomiting during pregnancy.  After fluid rehydration, she states that she is feeling much better and is ready to go home. She is hemodynamically stable and presentation not consistent with sepsis, HELLP, or preeclampsia.  I have discussed with her the importance of taking her blood sugar medication as well as checking her sugars at home.  I have also recommended she take Tylenol as needed for headaches and fevers. Evaluation and diagnostic testing in the emergency department does not suggest an emergent condition requiring admission or immediate intervention beyond what has been performed at this time.  Plan for discharge with close PCP follow-up.  Patient is understanding and amenable with plan, educated on red flag symptoms that would prompt immediate return.  Patient discharged in stable condition.   I discussed this case with my attending physician Dr. Nechama Guard who cosigned this note including patient's presenting symptoms, physical exam, and planned diagnostics and interventions. Attending physician stated agreement with plan or made changes to plan which were implemented.     Final Clinical Impression(s) / ED Diagnoses Final diagnoses:  Influenza B    Rx / DC Orders ED Discharge Orders     None     An After Visit Summary was printed and given to the patient.     Nestor Lewandowsky 08/02/22 Blanchie Dessert, MD 08/02/22  [phone]

## 2022-08-03 LAB — URINE CULTURE

## 2022-08-06 ENCOUNTER — Ambulatory Visit (INDEPENDENT_AMBULATORY_CARE_PROVIDER_SITE_OTHER): Payer: Self-pay | Admitting: Obstetrics and Gynecology

## 2022-08-06 VITALS — BP 107/68 | HR 86 | Wt 184.2 lb

## 2022-08-06 DIAGNOSIS — Z3A28 28 weeks gestation of pregnancy: Secondary | ICD-10-CM

## 2022-08-06 DIAGNOSIS — O0993 Supervision of high risk pregnancy, unspecified, third trimester: Secondary | ICD-10-CM

## 2022-08-06 DIAGNOSIS — O3662X0 Maternal care for excessive fetal growth, second trimester, not applicable or unspecified: Secondary | ICD-10-CM

## 2022-08-06 DIAGNOSIS — O403XX1 Polyhydramnios, third trimester, fetus 1: Secondary | ICD-10-CM

## 2022-08-06 DIAGNOSIS — O24410 Gestational diabetes mellitus in pregnancy, diet controlled: Secondary | ICD-10-CM

## 2022-08-06 DIAGNOSIS — O3663X0 Maternal care for excessive fetal growth, third trimester, not applicable or unspecified: Secondary | ICD-10-CM

## 2022-08-06 DIAGNOSIS — O0990 Supervision of high risk pregnancy, unspecified, unspecified trimester: Secondary | ICD-10-CM

## 2022-08-06 DIAGNOSIS — O402XX1 Polyhydramnios, second trimester, fetus 1: Secondary | ICD-10-CM

## 2022-08-06 NOTE — Progress Notes (Signed)
PRENATAL VISIT NOTE  Subjective:  Caroline [name] is a 37 y.o. [phone] at 70w3dbeing seen today for ongoing prenatal care.  She is currently monitored for the following issues for this high-risk pregnancy and has Umbilical hernia; Gestational diabetes mellitus (GDM), antepartum; Supervision of high risk pregnancy, antepartum; LGA (large for gestational age) fetus affecting management of mother; and Polyhydramnios on their problem list.  Patient reports no complaints.  Contractions: Not present. Vag. Bleeding: None.  Movement: Present. Denies leaking of fluid.   The following portions of the patient's history were reviewed and updated as appropriate: allergies, current medications, past family history, past medical history, past social history, past surgical history and problem list.   Objective:   Vitals:   08/06/22 1012  BP: 107/68  Pulse: 86  Weight: 184 lb 3.2 oz (83.6 kg)    Fetal Status:     Movement: Present     General:  Alert, oriented and cooperative. Patient is in no acute distress.  Skin: Skin is warm and dry. No rash noted.   Cardiovascular: Normal heart rate noted  Respiratory: Normal respiratory effort, no problems with respiration noted  Abdomen: Soft, gravid, appropriate for gestational age.  Pain/Pressure: Present     Pelvic: Cervical exam deferred        Extremities: Normal range of motion.  Edema: None  Mental Status: Normal mood and affect. Normal behavior. Normal judgment and thought content.   Assessment and Plan:  Pregnancy: G4P3003 at 258w3d. Supervision of high risk pregnancy, antepartum 28 wk labs today Tdap letter for her given.   2. Polyhydramnios in second trimester, fetus 1 of multiple gestation  3. Excessive fetal growth affecting management of pregnancy in second trimester, single or unspecified fetus Next growth is 3/8 - anatomy already showed macrosomia.   4. Gestational diabetes mellitus (GDM), antepartum Current regimen: She  is supposed to be on MTF but not taking it due to confusion if medication was for her or her children. She will start.   CBG review: Pt is not checking her CBGs except fasting and random, non PP checks. Fastings have been 98-101.  Regimen changes: None Growth [place] Antenatal monitoring: Begin antenatal testing at 32 weeks Other needs: Fetal echo and Follow up with [facility] She reports she will not be able to do fetal echo. She does not have the means.  - Reviewed diagnosis of GDM - Discussed the risks associated in pregnancy especially with poor control including but not limited to increased risk of preeclampsia, macrosomia, need for operative delivery I.e. vacuum, forcep, c-section, shoulder dystocia and resulting potential nerve injury. We discussed the risk of IUFD. Discussed risk of need for delivery at 37w if poorly controlled.   Preterm labor symptoms and general obstetric precautions including but not limited to vaginal bleeding, contractions, leaking of fluid and fetal movement were reviewed in detail with the patient. Please refer to After Visit Summary for other counseling recommendations.   No follow-ups on file.  Future Appointments  Date Time Provider [place]3/01/2023  3:30 PM [facility]/01/2023  3:45 PM [facility] US1 [facility]4/10/2022  3:15 PM [facility]SE [facility]/10/2022  3:30 PM [facility] US[facility]4/05/2023  3:15 PM [facility] NURSE [facility]4/05/2023  3:30 PM [facility] US3 [facility]4/19/2024  3:15 PM [facility] NURSE [facility]4/19/2024  3:30 PM [facility] US2 W[facility]-MFCUS WMMurray  PaRadene GunningMD

## 2022-08-07 LAB — CBC
Hematocrit: 38.7 % (ref 34.0–46.6)
Hemoglobin: 13.3 g/dL (ref 11.1–15.9)
MCH: 31.1 pg (ref 26.6–33.0)
MCHC: 34.4 g/dL (ref 31.5–35.7)
MCV: 91 fL (ref 79–97)
Platelets: 184 10*3/uL (ref 150–450)
RBC: 4.27 x10E6/uL (ref 3.77–5.28)
RDW: 12.3 % (ref 11.7–15.4)
WBC: 5.5 10*3/uL (ref 3.4–10.8)

## 2022-08-07 LAB — RPR: RPR Ser Ql: NONREACTIVE

## 2022-08-07 LAB — HIV ANTIBODY (ROUTINE TESTING W REFLEX): HIV Screen 4th Generation wRfx: NONREACTIVE

## 2022-08-08 ENCOUNTER — Ambulatory Visit: Payer: Self-pay | Admitting: *Deleted

## 2022-08-08 ENCOUNTER — Telehealth: Payer: Self-pay

## 2022-08-08 ENCOUNTER — Ambulatory Visit: Payer: Self-pay | Attending: Maternal & Fetal Medicine

## 2022-08-08 VITALS — BP 113/63 | HR 86

## 2022-08-08 DIAGNOSIS — O09523 Supervision of elderly multigravida, third trimester: Secondary | ICD-10-CM | POA: Insufficient documentation

## 2022-08-08 DIAGNOSIS — O0990 Supervision of high risk pregnancy, unspecified, unspecified trimester: Secondary | ICD-10-CM | POA: Insufficient documentation

## 2022-08-08 DIAGNOSIS — Z3A28 28 weeks gestation of pregnancy: Secondary | ICD-10-CM

## 2022-08-08 DIAGNOSIS — O24415 Gestational diabetes mellitus in pregnancy, controlled by oral hypoglycemic drugs: Secondary | ICD-10-CM

## 2022-08-08 DIAGNOSIS — O403XX0 Polyhydramnios, third trimester, not applicable or unspecified: Secondary | ICD-10-CM | POA: Insufficient documentation

## 2022-08-08 DIAGNOSIS — O24410 Gestational diabetes mellitus in pregnancy, diet controlled: Secondary | ICD-10-CM | POA: Insufficient documentation

## 2022-08-08 DIAGNOSIS — O3663X0 Maternal care for excessive fetal growth, third trimester, not applicable or unspecified: Secondary | ICD-10-CM

## 2022-08-08 NOTE — Telephone Encounter (Addendum)
-----   Message from Radene Gunning, MD sent at 08/07/2022 11:53 AM EST ----- Please inform of normal 28 wk labs. Also confirm she has started the MTF!  Thanks, pad   Called pt with interpreter Rosemarie Ax; results reviewed. Pt reports she forgot to pick up Metformin. Will go today.

## 2022-08-12 ENCOUNTER — Other Ambulatory Visit: Payer: Self-pay | Admitting: *Deleted

## 2022-08-12 DIAGNOSIS — O24419 Gestational diabetes mellitus in pregnancy, unspecified control: Secondary | ICD-10-CM

## 2022-08-12 DIAGNOSIS — O0990 Supervision of high risk pregnancy, unspecified, unspecified trimester: Secondary | ICD-10-CM

## 2022-08-14 ENCOUNTER — Other Ambulatory Visit: Payer: Self-pay

## 2022-08-27 ENCOUNTER — Encounter: Payer: Self-pay | Admitting: Obstetrics & Gynecology

## 2022-09-02 ENCOUNTER — Other Ambulatory Visit: Payer: Self-pay

## 2022-09-05 ENCOUNTER — Ambulatory Visit: Payer: Self-pay | Admitting: *Deleted

## 2022-09-05 ENCOUNTER — Ambulatory Visit: Payer: Self-pay | Attending: Maternal & Fetal Medicine

## 2022-09-05 VITALS — BP 116/68 | HR 92

## 2022-09-05 DIAGNOSIS — O0990 Supervision of high risk pregnancy, unspecified, unspecified trimester: Secondary | ICD-10-CM

## 2022-09-05 DIAGNOSIS — O24415 Gestational diabetes mellitus in pregnancy, controlled by oral hypoglycemic drugs: Secondary | ICD-10-CM

## 2022-09-05 DIAGNOSIS — O3663X0 Maternal care for excessive fetal growth, third trimester, not applicable or unspecified: Secondary | ICD-10-CM

## 2022-09-05 DIAGNOSIS — Z3A32 32 weeks gestation of pregnancy: Secondary | ICD-10-CM

## 2022-09-05 DIAGNOSIS — O403XX0 Polyhydramnios, third trimester, not applicable or unspecified: Secondary | ICD-10-CM | POA: Insufficient documentation

## 2022-09-05 DIAGNOSIS — O09523 Supervision of elderly multigravida, third trimester: Secondary | ICD-10-CM | POA: Insufficient documentation

## 2022-09-05 DIAGNOSIS — O24410 Gestational diabetes mellitus in pregnancy, diet controlled: Secondary | ICD-10-CM | POA: Insufficient documentation

## 2022-09-08 ENCOUNTER — Other Ambulatory Visit: Payer: Self-pay | Admitting: *Deleted

## 2022-09-08 DIAGNOSIS — O3663X0 Maternal care for excessive fetal growth, third trimester, not applicable or unspecified: Secondary | ICD-10-CM

## 2022-09-08 DIAGNOSIS — O09523 Supervision of elderly multigravida, third trimester: Secondary | ICD-10-CM

## 2022-09-08 DIAGNOSIS — O24415 Gestational diabetes mellitus in pregnancy, controlled by oral hypoglycemic drugs: Secondary | ICD-10-CM

## 2022-09-09 ENCOUNTER — Ambulatory Visit: Payer: Self-pay

## 2022-09-12 ENCOUNTER — Ambulatory Visit (HOSPITAL_BASED_OUTPATIENT_CLINIC_OR_DEPARTMENT_OTHER): Payer: Self-pay

## 2022-09-12 ENCOUNTER — Ambulatory Visit: Payer: Self-pay | Attending: Maternal & Fetal Medicine | Admitting: *Deleted

## 2022-09-12 VITALS — BP 116/62 | HR 82

## 2022-09-12 DIAGNOSIS — Z3A33 33 weeks gestation of pregnancy: Secondary | ICD-10-CM | POA: Insufficient documentation

## 2022-09-12 DIAGNOSIS — O403XX0 Polyhydramnios, third trimester, not applicable or unspecified: Secondary | ICD-10-CM

## 2022-09-12 DIAGNOSIS — O24419 Gestational diabetes mellitus in pregnancy, unspecified control: Secondary | ICD-10-CM | POA: Insufficient documentation

## 2022-09-12 DIAGNOSIS — O3663X0 Maternal care for excessive fetal growth, third trimester, not applicable or unspecified: Secondary | ICD-10-CM | POA: Insufficient documentation

## 2022-09-12 DIAGNOSIS — O09523 Supervision of elderly multigravida, third trimester: Secondary | ICD-10-CM

## 2022-09-12 DIAGNOSIS — O24415 Gestational diabetes mellitus in pregnancy, controlled by oral hypoglycemic drugs: Secondary | ICD-10-CM

## 2022-09-12 DIAGNOSIS — O24410 Gestational diabetes mellitus in pregnancy, diet controlled: Secondary | ICD-10-CM

## 2022-09-12 DIAGNOSIS — O0990 Supervision of high risk pregnancy, unspecified, unspecified trimester: Secondary | ICD-10-CM

## 2022-09-16 ENCOUNTER — Ambulatory Visit: Payer: Self-pay

## 2022-09-17 ENCOUNTER — Other Ambulatory Visit: Payer: Self-pay

## 2022-09-17 ENCOUNTER — Encounter: Payer: Self-pay | Admitting: Family Medicine

## 2022-09-17 ENCOUNTER — Ambulatory Visit (INDEPENDENT_AMBULATORY_CARE_PROVIDER_SITE_OTHER): Payer: Self-pay | Admitting: Family Medicine

## 2022-09-17 VITALS — BP 114/74 | HR 82 | Wt 193.3 lb

## 2022-09-17 DIAGNOSIS — O24415 Gestational diabetes mellitus in pregnancy, controlled by oral hypoglycemic drugs: Secondary | ICD-10-CM

## 2022-09-17 DIAGNOSIS — O3663X0 Maternal care for excessive fetal growth, third trimester, not applicable or unspecified: Secondary | ICD-10-CM

## 2022-09-17 DIAGNOSIS — O0993 Supervision of high risk pregnancy, unspecified, third trimester: Secondary | ICD-10-CM

## 2022-09-17 DIAGNOSIS — Z3A34 34 weeks gestation of pregnancy: Secondary | ICD-10-CM

## 2022-09-17 DIAGNOSIS — O0990 Supervision of high risk pregnancy, unspecified, unspecified trimester: Secondary | ICD-10-CM

## 2022-09-17 NOTE — Progress Notes (Signed)
   PRENATAL VISIT NOTE  Subjective:  Caroline [name] is a 37 y.o. [phone] at [redacted]w[redacted]d being seen today for ongoing prenatal care.  She is currently monitored for the following issues for this low-risk pregnancy and has Umbilical hernia; Gestational diabetes mellitus (GDM), antepartum; Supervision of high risk pregnancy, antepartum; LGA (large for gestational age) fetus affecting management of mother; and Polyhydramnios on their problem list.  Patient reports no complaints.  Contractions: Not present. Vag. Bleeding: None.  Movement: Present. Denies leaking of fluid.   The following portions of the patient's history were reviewed and updated as appropriate: allergies, current medications, past family history, past medical history, past social history, past surgical history and problem list.   Objective:   Vitals:   09/17/22 0904  BP: 114/74  Pulse: 82  Weight: 193 lb 4.8 oz (87.7 kg)    Fetal Status: Fetal Heart Rate (bpm): 140   Movement: Present     General:  Alert, oriented and cooperative. Patient is in no acute distress.  Skin: Skin is warm and dry. No rash noted.   Cardiovascular: Normal heart rate noted  Respiratory: Normal respiratory effort, no problems with respiration noted  Abdomen: Soft, gravid, appropriate for gestational age.  Pain/Pressure: Absent     Pelvic: Cervical exam deferred        Extremities: Normal range of motion.     Mental Status: Normal mood and affect. Normal behavior. Normal judgment and thought content.   Assessment and Plan:  Pregnancy: G4P3003 at [redacted]w[redacted]d 1. Supervision of high risk pregnancy, antepartum FH is elevated Vigorous movement  2. Excessive fetal growth affecting management of pregnancy in third trimester, single or unspecified fetus EFW 97th% and AC > 99th% on last [place] 4/5 Has follow up growth  3. Gestational diabetes mellitus (GDM) controlled on oral hypoglycemic drug, antepartum Not taking medications and does not have log  today Fasting today was 99 Infant is known LGA with Poly which is consistent with poorly controlled DM. Discussed and recommended IOL at 37 weeks Requested IOL today for 5/9 at midnight-- patient preference. I recommended 5/5 and patient strongly desires delivery on 5/10. This remains within the 37th week   Preterm labor symptoms and general obstetric precautions including but not limited to vaginal bleeding, contractions, leaking of fluid and fetal movement were reviewed in detail with the patient. Please refer to After Visit Summary for other counseling recommendations.   Return in about 2 weeks (around 10/01/2022) for Routine prenatal care, High Risk OB, 36wks.  Future Appointments  Date Time Provider Department Center  09/19/2022  3:15 PM [facility] NURSE [facility]  09/19/2022  3:30 PM [facility]  09/26/2022  3:00 PM [facility] NURSE [facility]  09/26/2022  3:15 PM [facility] NST [facility]  10/02/2022  1:45 PM [facility] NURSE [facility]  10/02/2022  2:00 PM [facility] US1 [facility]MFCUS W[facility]    Federico Flake, MD

## 2022-09-18 ENCOUNTER — Encounter: Payer: Self-pay | Admitting: General Practice

## 2022-09-19 ENCOUNTER — Ambulatory Visit (HOSPITAL_BASED_OUTPATIENT_CLINIC_OR_DEPARTMENT_OTHER): Payer: Self-pay

## 2022-09-19 ENCOUNTER — Ambulatory Visit: Payer: Self-pay | Attending: Maternal & Fetal Medicine | Admitting: *Deleted

## 2022-09-19 VITALS — BP 110/71 | HR 87

## 2022-09-19 DIAGNOSIS — O403XX0 Polyhydramnios, third trimester, not applicable or unspecified: Secondary | ICD-10-CM

## 2022-09-19 DIAGNOSIS — O3663X0 Maternal care for excessive fetal growth, third trimester, not applicable or unspecified: Secondary | ICD-10-CM

## 2022-09-19 DIAGNOSIS — O24410 Gestational diabetes mellitus in pregnancy, diet controlled: Secondary | ICD-10-CM

## 2022-09-19 DIAGNOSIS — O09523 Supervision of elderly multigravida, third trimester: Secondary | ICD-10-CM | POA: Insufficient documentation

## 2022-09-19 DIAGNOSIS — Z3A34 34 weeks gestation of pregnancy: Secondary | ICD-10-CM | POA: Insufficient documentation

## 2022-09-19 DIAGNOSIS — O0990 Supervision of high risk pregnancy, unspecified, unspecified trimester: Secondary | ICD-10-CM

## 2022-09-19 DIAGNOSIS — O24415 Gestational diabetes mellitus in pregnancy, controlled by oral hypoglycemic drugs: Secondary | ICD-10-CM

## 2022-09-26 ENCOUNTER — Other Ambulatory Visit: Payer: Self-pay

## 2022-09-26 ENCOUNTER — Telehealth (HOSPITAL_COMMUNITY): Payer: Self-pay | Admitting: *Deleted

## 2022-09-26 ENCOUNTER — Ambulatory Visit: Payer: Self-pay

## 2022-09-26 ENCOUNTER — Ambulatory Visit: Payer: Self-pay | Admitting: *Deleted

## 2022-09-26 ENCOUNTER — Ambulatory Visit: Payer: Self-pay | Attending: Obstetrics

## 2022-09-26 ENCOUNTER — Encounter (HOSPITAL_COMMUNITY): Payer: Self-pay | Admitting: *Deleted

## 2022-09-26 DIAGNOSIS — Z3A35 35 weeks gestation of pregnancy: Secondary | ICD-10-CM

## 2022-09-26 DIAGNOSIS — O09523 Supervision of elderly multigravida, third trimester: Secondary | ICD-10-CM | POA: Insufficient documentation

## 2022-09-26 DIAGNOSIS — O24419 Gestational diabetes mellitus in pregnancy, unspecified control: Secondary | ICD-10-CM

## 2022-09-26 NOTE — Procedures (Signed)
Caroline [name] [birth year] [redacted]w[redacted]d  Fetus A Non-Stress Test Interpretation for 09/26/22  Indication: Gestational Diabetes medication controlled and Advanced Maternal Age >40 years  Fetal Heart Rate A Mode: External Baseline Rate (A): 140 bpm Variability: Moderate Accelerations: 15 x 15 Decelerations: None Multiple birth?: No  Uterine Activity Mode: Toco Contraction Frequency (min): 4 Contraction Duration (sec): 60-90 Contraction Quality: Mild Resting Tone Palpated: Relaxed  Interpretation (Fetal Testing) Nonstress Test Interpretation: Reactive Overall Impression: Reassuring for gestational age Comments: Tracing reviewed by Dr. Darra Lis

## 2022-09-26 NOTE — Telephone Encounter (Signed)
Preadmission screen  

## 2022-09-29 ENCOUNTER — Ambulatory Visit: Payer: Self-pay

## 2022-09-29 ENCOUNTER — Other Ambulatory Visit: Payer: Self-pay

## 2022-10-01 ENCOUNTER — Encounter: Payer: Self-pay | Admitting: *Deleted

## 2022-10-02 ENCOUNTER — Ambulatory Visit (INDEPENDENT_AMBULATORY_CARE_PROVIDER_SITE_OTHER): Payer: Medicaid Other | Admitting: Obstetrics and Gynecology

## 2022-10-02 ENCOUNTER — Ambulatory Visit: Payer: Self-pay | Admitting: *Deleted

## 2022-10-02 ENCOUNTER — Ambulatory Visit: Payer: Medicaid Other | Attending: Obstetrics

## 2022-10-02 ENCOUNTER — Other Ambulatory Visit (HOSPITAL_COMMUNITY)
Admission: RE | Admit: 2022-10-02 | Discharge: 2022-10-02 | Disposition: A | Discharge status: Home / Self Care | Payer: Self-pay | Source: Ambulatory Visit | Attending: Family Medicine | Admitting: Family Medicine

## 2022-10-02 VITALS — BP 131/76 | HR 80

## 2022-10-02 VITALS — BP 112/80 | HR 92 | Wt 195.1 lb

## 2022-10-02 DIAGNOSIS — O24415 Gestational diabetes mellitus in pregnancy, controlled by oral hypoglycemic drugs: Secondary | ICD-10-CM | POA: Insufficient documentation

## 2022-10-02 DIAGNOSIS — Z758 Other problems related to medical facilities and other health care: Secondary | ICD-10-CM

## 2022-10-02 DIAGNOSIS — Z3A36 36 weeks gestation of pregnancy: Secondary | ICD-10-CM

## 2022-10-02 DIAGNOSIS — O0993 Supervision of high risk pregnancy, unspecified, third trimester: Secondary | ICD-10-CM

## 2022-10-02 DIAGNOSIS — O24419 Gestational diabetes mellitus in pregnancy, unspecified control: Secondary | ICD-10-CM | POA: Insufficient documentation

## 2022-10-02 DIAGNOSIS — O09523 Supervision of elderly multigravida, third trimester: Secondary | ICD-10-CM | POA: Insufficient documentation

## 2022-10-02 DIAGNOSIS — O3663X0 Maternal care for excessive fetal growth, third trimester, not applicable or unspecified: Secondary | ICD-10-CM | POA: Diagnosis present

## 2022-10-02 DIAGNOSIS — O0990 Supervision of high risk pregnancy, unspecified, unspecified trimester: Secondary | ICD-10-CM

## 2022-10-02 DIAGNOSIS — Z603 Acculturation difficulty: Secondary | ICD-10-CM | POA: Diagnosis not present

## 2022-10-02 LAB — GLUCOSE, CAPILLARY: Glucose-Capillary: 131 mg/dL — ABNORMAL HIGH (ref 70–99)

## 2022-10-02 NOTE — Progress Notes (Signed)
    PRENATAL VISIT NOTE  Subjective:  Caroline [name] is a 37 y.o. [phone] at [redacted]w[redacted]d being seen today for ongoing prenatal care.  She is currently monitored for the following issues for this high-risk pregnancy and has Gestational diabetes mellitus (GDM), antepartum; Supervision of high risk pregnancy, antepartum; LGA (large for gestational age) fetus affecting management of mother; Polyhydramnios; and Language barrier on their problem list.  Patient reports no complaints.  Contractions: Not present. Vag. Bleeding: None.  Movement: Present. Denies leaking of fluid.   The following portions of the patient's history were reviewed and updated as appropriate: allergies, current medications, past family history, past medical history, past social history, past surgical history and problem list.   Objective:   Vitals:   10/02/22 1530  BP: 112/80  Pulse: 92  Weight: 195 lb 1.6 oz (88.5 kg)    Fetal Status:     Movement: Present     General:  Alert, oriented and cooperative. Patient is in no acute distress.  Skin: Skin is warm and dry. No rash noted.   Cardiovascular: Normal heart rate noted  Respiratory: Normal respiratory effort, no problems with respiration noted  Abdomen: Soft, gravid, appropriate for gestational age.  Pain/Pressure: Present     Pelvic: Cervical exam deferred        Extremities: Normal range of motion.  Edema: None  Mental Status: Normal mood and affect. Normal behavior. Normal judgment and thought content.   Assessment and Plan:  Pregnancy: G4P3003 at [redacted]w[redacted]d 1. [redacted] weeks gestation of pregnancy - GC/Chlamydia probe amp ([place])not at [facility] - Culture, beta strep (group b only)  2. Gestational diabetes mellitus (GDM), antepartum, gestational diabetes method of control unspecified Patient not taking the metformin. She did not bring her logbook but states her AM fastings and 2 hour post prandials are about 10 points above range. D/w her rationale for metformin  and recommendation for this. CBG 131 and she ate lunch 2 hours ago. Largest baby 3800gm and no GDM U/s today showed 81%, 3270gm, ac 94%, afi 16, bpp 8/8, cephalic Pt wants to deliver on 5/10 and she is already set up for 5/9 @ 2345 IOL Follow rpt bpp next week  3. Supervision of high risk pregnancy, antepartum  4. Language barrier In person interpreter used  Preterm labor symptoms and general obstetric precautions including but not limited to vaginal bleeding, contractions, leaking of fluid and fetal movement were reviewed in detail with the patient. Please refer to After Visit Summary for other counseling recommendations.   Return in about 6 days (around 10/08/2022) for high risk ob, md visit, in person.  Future Appointments  Date Time Provider Department Center  10/08/2022  3:00 PM [facility] NURSE [facility]  10/08/2022  3:15 PM [facility] NST [facility]  10/09/2022  3:35 PM [place], MD [facility]  10/10/2022 12:00 AM [facility] SCHED ROOM [facility]-INDC None    [place] Bing, MD

## 2022-10-03 ENCOUNTER — Other Ambulatory Visit: Payer: Self-pay | Admitting: Advanced Practice Midwife

## 2022-10-03 DIAGNOSIS — Z758 Other problems related to medical facilities and other health care: Secondary | ICD-10-CM | POA: Insufficient documentation

## 2022-10-03 LAB — GC/CHLAMYDIA PROBE AMP (~~LOC~~) NOT AT ARMC
Chlamydia: NEGATIVE
Comment: NEGATIVE
Comment: NORMAL
Neisseria Gonorrhea: NEGATIVE

## 2022-10-06 LAB — CULTURE, BETA STREP (GROUP B ONLY): Strep Gp B Culture: NEGATIVE

## 2022-10-08 ENCOUNTER — Other Ambulatory Visit: Payer: Self-pay | Admitting: Advanced Practice Midwife

## 2022-10-08 ENCOUNTER — Ambulatory Visit: Payer: Self-pay | Admitting: *Deleted

## 2022-10-08 ENCOUNTER — Ambulatory Visit: Payer: Medicaid Other | Attending: Maternal & Fetal Medicine | Admitting: *Deleted

## 2022-10-08 VITALS — BP 130/77 | HR 78

## 2022-10-08 DIAGNOSIS — O24419 Gestational diabetes mellitus in pregnancy, unspecified control: Secondary | ICD-10-CM | POA: Insufficient documentation

## 2022-10-08 DIAGNOSIS — Z3A37 37 weeks gestation of pregnancy: Secondary | ICD-10-CM | POA: Insufficient documentation

## 2022-10-08 DIAGNOSIS — O09523 Supervision of elderly multigravida, third trimester: Secondary | ICD-10-CM | POA: Insufficient documentation

## 2022-10-08 DIAGNOSIS — O0990 Supervision of high risk pregnancy, unspecified, unspecified trimester: Secondary | ICD-10-CM

## 2022-10-08 NOTE — Procedures (Signed)
Caroline [name] 27-Nov-1985 [redacted]w[redacted]d  Fetus A Non-Stress Test Interpretation for 10/08/22  Indication: Gestational Diabetes medication controlled and Advanced Maternal Age >40 years  Fetal Heart Rate A Mode: External Baseline Rate (A): 150 bpm Variability: Moderate, Minimal Accelerations: 15 x 15 Decelerations: None Multiple birth?: No  Uterine Activity Mode: Toco Contraction Frequency (min): 3-5 Contraction Duration (sec): 90-140 Contraction Quality: Mild (pt says they feel tight and thats all) Resting Tone Palpated: Relaxed  Interpretation (Fetal Testing) Nonstress Test Interpretation: Reactive Overall Impression: Reassuring for gestational age Comments: Tracing reviewed byDr. Parke Poisson

## 2022-10-09 ENCOUNTER — Ambulatory Visit (INDEPENDENT_AMBULATORY_CARE_PROVIDER_SITE_OTHER): Payer: Medicaid Other | Admitting: Obstetrics and Gynecology

## 2022-10-09 ENCOUNTER — Other Ambulatory Visit: Payer: Self-pay

## 2022-10-09 VITALS — BP 113/77 | HR 91 | Wt 199.1 lb

## 2022-10-09 DIAGNOSIS — O24415 Gestational diabetes mellitus in pregnancy, controlled by oral hypoglycemic drugs: Secondary | ICD-10-CM

## 2022-10-09 DIAGNOSIS — O3663X0 Maternal care for excessive fetal growth, third trimester, not applicable or unspecified: Secondary | ICD-10-CM | POA: Diagnosis not present

## 2022-10-09 DIAGNOSIS — Z3A37 37 weeks gestation of pregnancy: Secondary | ICD-10-CM

## 2022-10-09 DIAGNOSIS — O0993 Supervision of high risk pregnancy, unspecified, third trimester: Secondary | ICD-10-CM

## 2022-10-09 NOTE — Progress Notes (Signed)
   PRENATAL VISIT NOTE  Subjective:  Caroline [name] is a 37 y.o. [phone] at [redacted]w[redacted]d being seen today for ongoing prenatal care.  She is currently monitored for the following issues for this high-risk pregnancy and has Gestational diabetes mellitus (GDM), antepartum; Supervision of high risk pregnancy, antepartum; LGA (large for gestational age) fetus affecting management of mother; Polyhydramnios; and Language barrier on their problem list.  Patient reports no complaints.  Contractions: Not present. Vag. Bleeding: None.  Movement: Present. Denies leaking of fluid.   The following portions of the patient's history were reviewed and updated as appropriate: allergies, current medications, past family history, past medical history, past social history, past surgical history and problem list.   Objective:   Vitals:   10/09/22 1529  BP: 113/77  Pulse: 91  Weight: 199 lb 1.6 oz (90.3 kg)    Fetal Status: Fetal Heart Rate (bpm): 152 Fundal Height: 37 cm Movement: Present  Presentation: Vertex  General:  Alert, oriented and cooperative. Patient is in no acute distress.  Skin: Skin is warm and dry. No rash noted.   Cardiovascular: Normal heart rate noted  Respiratory: Normal respiratory effort, no problems with respiration noted  Abdomen: Soft, gravid, appropriate for gestational age.  Pain/Pressure: Present     Pelvic: Cervical exam deferred Dilation: 2.5 Effacement (%): 50 Station: -2  Extremities: Normal range of motion.  Edema: Trace  Mental Status: Normal mood and affect. Normal behavior. Normal judgment and thought content.   Assessment and Plan:  Pregnancy: [id number] at [redacted]w[redacted]d 1. [redacted] weeks gestation of pregnancy GBS neg  2. Gestational diabetes mellitus (GDM), antepartum, gestational diabetes method of control unspecified Patient set up for IOL tonight at 2345.  Reactive NST with mfm yesterday.  Largest baby 3800gm and no GDM U/s on 5/2  showed 81%, 3270gm, ac 94%, afi 16, bpp  8/8, cephalic   3. Supervision of high risk pregnancy, antepartum   4. Language barrier Ipad interpreter used  Term labor symptoms and general obstetric precautions including but not limited to vaginal bleeding, contractions, leaking of fluid and fetal movement were reviewed in detail with the patient. Please refer to After Visit Summary for other counseling recommendations.   Return if symptoms worsen or fail to improve.  Future Appointments  Date Time Provider Department Center  10/10/2022 12:00 AM [facility] SCHED ROOM [facility] None    Norridge Bing, MD

## 2022-10-10 ENCOUNTER — Inpatient Hospital Stay (HOSPITAL_COMMUNITY): Payer: Medicaid Other | Admitting: Anesthesiology

## 2022-10-10 ENCOUNTER — Inpatient Hospital Stay (HOSPITAL_COMMUNITY)
Admission: RE | Admit: 2022-10-10 | Discharge: 2022-10-11 | DRG: 807 | Disposition: A | Discharge status: Home / Self Care | Payer: Medicaid Other | Attending: Obstetrics & Gynecology | Admitting: Obstetrics & Gynecology

## 2022-10-10 ENCOUNTER — Inpatient Hospital Stay (HOSPITAL_COMMUNITY): Payer: Medicaid Other

## 2022-10-10 ENCOUNTER — Encounter (HOSPITAL_COMMUNITY): Payer: Self-pay | Admitting: Obstetrics & Gynecology

## 2022-10-10 DIAGNOSIS — O3663X0 Maternal care for excessive fetal growth, third trimester, not applicable or unspecified: Secondary | ICD-10-CM | POA: Diagnosis present

## 2022-10-10 DIAGNOSIS — Z3A37 37 weeks gestation of pregnancy: Secondary | ICD-10-CM

## 2022-10-10 DIAGNOSIS — O24425 Gestational diabetes mellitus in childbirth, controlled by oral hypoglycemic drugs: Secondary | ICD-10-CM | POA: Diagnosis present

## 2022-10-10 DIAGNOSIS — O3660X0 Maternal care for excessive fetal growth, unspecified trimester, not applicable or unspecified: Secondary | ICD-10-CM | POA: Diagnosis present

## 2022-10-10 DIAGNOSIS — O403XX0 Polyhydramnios, third trimester, not applicable or unspecified: Secondary | ICD-10-CM | POA: Diagnosis present

## 2022-10-10 DIAGNOSIS — Z758 Other problems related to medical facilities and other health care: Secondary | ICD-10-CM | POA: Diagnosis present

## 2022-10-10 DIAGNOSIS — O409XX0 Polyhydramnios, unspecified trimester, not applicable or unspecified: Secondary | ICD-10-CM | POA: Diagnosis present

## 2022-10-10 DIAGNOSIS — O0990 Supervision of high risk pregnancy, unspecified, unspecified trimester: Principal | ICD-10-CM

## 2022-10-10 DIAGNOSIS — O24419 Gestational diabetes mellitus in pregnancy, unspecified control: Principal | ICD-10-CM | POA: Diagnosis present

## 2022-10-10 DIAGNOSIS — O24424 Gestational diabetes mellitus in childbirth, insulin controlled: Secondary | ICD-10-CM | POA: Diagnosis not present

## 2022-10-10 LAB — CBC
HCT: 39.1 % (ref 36.0–46.0)
Hemoglobin: 13.5 g/dL (ref 12.0–15.0)
MCH: 31.9 pg (ref 26.0–34.0)
MCHC: 34.5 g/dL (ref 30.0–36.0)
MCV: 92.4 fL (ref 80.0–100.0)
Platelets: 138 10*3/uL — ABNORMAL LOW (ref 150–400)
RBC: 4.23 MIL/uL (ref 3.87–5.11)
RDW: 14.3 % (ref 11.5–15.5)
WBC: 7.2 10*3/uL (ref 4.0–10.5)
nRBC: 0 % (ref 0.0–0.2)

## 2022-10-10 LAB — GLUCOSE, CAPILLARY: Glucose-Capillary: 96 mg/dL (ref 70–99)

## 2022-10-10 LAB — TYPE AND SCREEN
ABO/RH(D): O POS
Antibody Screen: NEGATIVE

## 2022-10-10 LAB — RPR: RPR Ser Ql: NONREACTIVE

## 2022-10-10 MED ORDER — MISOPROSTOL 50MCG HALF TABLET: 50.0000 ug | ORAL_TABLET | Freq: Once | ORAL | Status: DC

## 2022-10-10 MED ORDER — TERBUTALINE SULFATE 1 MG/ML IJ SOLN: 0.2500 mg | Freq: Once | INTRAMUSCULAR | Status: DC | PRN

## 2022-10-10 MED ORDER — OXYTOCIN-SODIUM CHLORIDE 30-0.9 UT/500ML-% IV SOLN
1.0000 m[IU]/min | INTRAVENOUS | Status: DC
  Administered 2022-10-10: 2 m[IU]/min via INTRAVENOUS

## 2022-10-10 MED ORDER — OXYTOCIN-SODIUM CHLORIDE 30-0.9 UT/500ML-% IV SOLN
2.5000 [IU]/h | INTRAVENOUS | Status: DC
  Administered 2022-10-10: 2.5 [IU]/h via INTRAVENOUS
  Filled 2022-10-10: qty 500

## 2022-10-10 MED ORDER — MEASLES, MUMPS & RUBELLA VAC IJ SOLR: 0.5000 mL | Freq: Once | INTRAMUSCULAR | Status: DC

## 2022-10-10 MED ORDER — ONDANSETRON HCL 4 MG/2ML IJ SOLN: 4.0000 mg | INTRAMUSCULAR | Status: DC | PRN

## 2022-10-10 MED ORDER — OXYCODONE-ACETAMINOPHEN 5-325 MG PO TABS: 2.0000 | ORAL_TABLET | ORAL | Status: DC | PRN

## 2022-10-10 MED ORDER — FLEET ENEMA 7-19 GM/118ML RE ENEM: 1.0000 | ENEMA | Freq: Every day | RECTAL | Status: DC | PRN

## 2022-10-10 MED ORDER — EPHEDRINE 5 MG/ML INJ: 10.0000 mg | INTRAVENOUS | Status: DC | PRN

## 2022-10-10 MED ORDER — SIMETHICONE 80 MG PO CHEW: 80.0000 mg | CHEWABLE_TABLET | ORAL | Status: DC | PRN

## 2022-10-10 MED ORDER — FENTANYL-BUPIVACAINE-NACL 0.5-0.125-0.9 MG/250ML-% EP SOLN
12.0000 mL/h | EPIDURAL | Status: DC | PRN
  Administered 2022-10-10: 12 mL/h via EPIDURAL
  Filled 2022-10-10: qty 250

## 2022-10-10 MED ORDER — METHYLERGONOVINE MALEATE 0.2 MG PO TABS: 0.2000 mg | ORAL_TABLET | ORAL | Status: DC | PRN

## 2022-10-10 MED ORDER — LIDOCAINE HCL (PF) 1 % IJ SOLN
INTRAMUSCULAR | Status: DC | PRN
  Administered 2022-10-10: 11 mL via EPIDURAL

## 2022-10-10 MED ORDER — ACETAMINOPHEN 325 MG PO TABS: 650.0000 mg | ORAL_TABLET | ORAL | Status: DC | PRN

## 2022-10-10 MED ORDER — FENTANYL CITRATE (PF) 100 MCG/2ML IJ SOLN: 100.0000 ug | INTRAMUSCULAR | Status: DC | PRN

## 2022-10-10 MED ORDER — SOD CITRATE-CITRIC ACID 500-334 MG/5ML PO SOLN: 30.0000 mL | ORAL | Status: DC | PRN

## 2022-10-10 MED ORDER — PHENYLEPHRINE 80 MCG/ML (10ML) SYRINGE FOR IV PUSH (FOR BLOOD PRESSURE SUPPORT)
80.0000 ug | PREFILLED_SYRINGE | INTRAVENOUS | Status: DC | PRN
  Filled 2022-10-10: qty 10

## 2022-10-10 MED ORDER — PRENATAL MULTIVITAMIN CH
1.0000 | ORAL_TABLET | Freq: Every day | ORAL | Status: DC
  Administered 2022-10-11: 1 via ORAL
  Filled 2022-10-10: qty 1

## 2022-10-10 MED ORDER — OXYTOCIN BOLUS FROM INFUSION
333.0000 mL | Freq: Once | INTRAVENOUS | Status: AC
  Administered 2022-10-10: 333 mL via INTRAVENOUS

## 2022-10-10 MED ORDER — MISOPROSTOL 25 MCG QUARTER TABLET: 25.0000 ug | ORAL_TABLET | Freq: Once | ORAL | Status: DC

## 2022-10-10 MED ORDER — COCONUT OIL OIL: 1.0000 | TOPICAL_OIL | Status: DC | PRN

## 2022-10-10 MED ORDER — METHYLERGONOVINE MALEATE 0.2 MG/ML IJ SOLN: 0.2000 mg | INTRAMUSCULAR | Status: DC | PRN

## 2022-10-10 MED ORDER — OXYCODONE-ACETAMINOPHEN 5-325 MG PO TABS: 1.0000 | ORAL_TABLET | ORAL | Status: DC | PRN

## 2022-10-10 MED ORDER — TETANUS-DIPHTH-ACELL PERTUSSIS 5-2.5-18.5 LF-MCG/0.5 IM SUSY: 0.5000 mL | PREFILLED_SYRINGE | Freq: Once | INTRAMUSCULAR | Status: DC

## 2022-10-10 MED ORDER — FERROUS SULFATE 325 (65 FE) MG PO TABS
325.0000 mg | ORAL_TABLET | ORAL | Status: DC
  Administered 2022-10-10: 325 mg via ORAL
  Filled 2022-10-10: qty 1

## 2022-10-10 MED ORDER — LACTATED RINGERS IV SOLN: INTRAVENOUS | Status: DC

## 2022-10-10 MED ORDER — ONDANSETRON HCL 4 MG PO TABS: 4.0000 mg | ORAL_TABLET | ORAL | Status: DC | PRN

## 2022-10-10 MED ORDER — SENNOSIDES-DOCUSATE SODIUM 8.6-50 MG PO TABS
2.0000 | ORAL_TABLET | ORAL | Status: DC
  Administered 2022-10-11: 2 via ORAL
  Filled 2022-10-10: qty 2

## 2022-10-10 MED ORDER — DIBUCAINE (PERIANAL) 1 % EX OINT: 1.0000 | TOPICAL_OINTMENT | CUTANEOUS | Status: DC | PRN

## 2022-10-10 MED ORDER — TRANEXAMIC ACID-NACL 1000-0.7 MG/100ML-% IV SOLN
1000.0000 mg | INTRAVENOUS | Status: AC
  Administered 2022-10-10: 1000 mg via INTRAVENOUS

## 2022-10-10 MED ORDER — LIDOCAINE HCL (PF) 1 % IJ SOLN: 30.0000 mL | INTRAMUSCULAR | Status: DC | PRN

## 2022-10-10 MED ORDER — PHENYLEPHRINE 80 MCG/ML (10ML) SYRINGE FOR IV PUSH (FOR BLOOD PRESSURE SUPPORT): 80.0000 ug | PREFILLED_SYRINGE | INTRAVENOUS | Status: DC | PRN

## 2022-10-10 MED ORDER — LACTATED RINGERS IV SOLN: 500.0000 mL | Freq: Once | INTRAVENOUS | Status: DC

## 2022-10-10 MED ORDER — TRANEXAMIC ACID-NACL 1000-0.7 MG/100ML-% IV SOLN
INTRAVENOUS | Status: AC
  Administered 2022-10-10: 1000 mg
  Filled 2022-10-10: qty 100

## 2022-10-10 MED ORDER — IBUPROFEN 600 MG PO TABS
600.0000 mg | ORAL_TABLET | Freq: Four times a day (QID) | ORAL | Status: DC
  Administered 2022-10-10 – 2022-10-11 (×5): 600 mg via ORAL
  Filled 2022-10-10 (×5): qty 1

## 2022-10-10 MED ORDER — BENZOCAINE-MENTHOL 20-0.5 % EX AERO
1.0000 | INHALATION_SPRAY | CUTANEOUS | Status: DC | PRN
  Administered 2022-10-10: 1 via TOPICAL
  Filled 2022-10-10: qty 56

## 2022-10-10 MED ORDER — MEDROXYPROGESTERONE ACETATE 150 MG/ML IM SUSP: 150.0000 mg | INTRAMUSCULAR | Status: DC | PRN

## 2022-10-10 MED ORDER — ACETAMINOPHEN 325 MG PO TABS
650.0000 mg | ORAL_TABLET | ORAL | Status: DC | PRN
  Administered 2022-10-10: 650 mg via ORAL
  Filled 2022-10-10: qty 2

## 2022-10-10 MED ORDER — ONDANSETRON HCL 4 MG/2ML IJ SOLN: 4.0000 mg | Freq: Four times a day (QID) | INTRAMUSCULAR | Status: DC | PRN

## 2022-10-10 MED ORDER — LACTATED RINGERS IV SOLN: 500.0000 mL | INTRAVENOUS | Status: DC | PRN

## 2022-10-10 MED ORDER — DIPHENHYDRAMINE HCL 25 MG PO CAPS: 25.0000 mg | ORAL_CAPSULE | Freq: Four times a day (QID) | ORAL | Status: DC | PRN

## 2022-10-10 MED ORDER — DIPHENHYDRAMINE HCL 50 MG/ML IJ SOLN: 12.5000 mg | INTRAMUSCULAR | Status: DC | PRN

## 2022-10-10 MED ORDER — BISACODYL 10 MG RE SUPP: 10.0000 mg | Freq: Every day | RECTAL | Status: DC | PRN

## 2022-10-10 MED ORDER — WITCH HAZEL-GLYCERIN EX PADS: 1.0000 | MEDICATED_PAD | CUTANEOUS | Status: DC | PRN

## 2022-10-10 NOTE — Lactation Note (Signed)
This note was copied from a baby's chart. Lactation Consultation Note  Patient Name: Boy Shalayna Crail [id number] Date: 10/10/2022 Age:37 hours Reason for consult: Initial assessment;Early term 37-38.6wks;Breastfeeding assistance;Maternal endocrine disorder  In-house Spanish Interpretor Abhigna used during the consultation.  The infant was at 6 hours old.  The LC entered the room and the infant was in the bassinet.  The birth parent stated that things were going ok with breastfeeding.  She said that the infant's latch is not as strong as her older children.  She also said that he did not want the formula so she has decided to do more breastfeeding.  The birth parent is an experienced breastfeeding parent.  She said that she breast fed her older 3 children for 2 years.  The birth parent was given a manual pump per her request.  LC reviewed assembling, disassembling, and washing pump parts.  The birth parent commented that she was going to give the infant a pacifier.  LC educated the birth parent on pacifier usage.  LC also spoke with the birth parent about feeding the infant according to his feeding cues.  LC wrote her name on the board and encouraged the birth parent to call for lactation assistance.  The birth parent had no questions or concerns.   Infant Feeding Plan: Breastfeed 8+times in 24 hours according to feeding cues.  Put the infant to the breast prior to supplementing.  Call RN/LC for assistance with breastfeeding.    Maternal Data Has patient been taught Hand Expression?: Yes Does the patient have breastfeeding experience prior to this delivery?: Yes How long did the patient breastfeed?: She breast fed her 3 older children for 2 years  Feeding Mother's Current Feeding Choice: Breast Milk and Formula   Lactation Tools Discussed/Used Tools: Pump Breast pump type: Manual Pump Education: Setup, frequency, and cleaning Reason for Pumping: Birth parent's request;  Will use it PRN at home  Interventions Interventions: Education;LC Services brochure  Discharge Pump: Personal;Manual  Consult Status Consult Status: Follow-up Date: 10/11/22 Follow-up type: In-patient   Orvil Feil Ferdinando Lodge 10/10/2022, 4:05 PM

## 2022-10-10 NOTE — H&P (Signed)
Caroline [name] is a 37 y.o. [phone] at [redacted]w[redacted]d being admitted for induction of labor for Gestational Diabetes and LGA.    She is currently monitored for the following issues for this high-risk pregnancy and has Gestational diabetes mellitus (GDM), antepartum; Supervision of high risk pregnancy, antepartum; LGA (large for gestational age) fetus affecting management of mother; Polyhydramnios; and Language barrier on their problem list.  OB History     Gravida  4   Para  3   Term  3   Preterm  0   AB  0   Living  3      SAB  0   IAB  0   Ectopic  0   Multiple  0   Live Births  3          Past Medical History:  Diagnosis Date   Gestational diabetes    Umbilical hernia 2017   Umbilical hernia 02/22/2016   Past Surgical History:  Procedure Laterality Date   CHOLECYSTECTOMY N/A 07/14/2016   Procedure: LAPAROSCOPIC CHOLECYSTECTOMY;  Surgeon: Caroline Gelinas, MD;  Location: [facility];  Service: General;  Laterality: N/A;   NO PAST SURGERIES     Family History: family history includes Hypertension in her father. Social History:  reports that she has never smoked. She has never used smokeless tobacco. She reports current alcohol use. She reports that she does not use drugs.     Maternal Diabetes: Yes:  Diabetes Type:  Insulin/Medication controlled  Metformin prescribed but patient did not take Genetic Screening: Normal Maternal Ultrasounds/Referrals: Normal Fetal Ultrasounds or other Referrals:  None Maternal Substance Abuse:  No Significant Maternal Medications:  Meds include: Other:  Significant Maternal Lab Results:  Group B Strep negative Number of Prenatal Visits:greater than 3 verified prenatal visits Other Comments:  None  Review of Systems  Constitutional:  Negative for chills and fever.  Respiratory:  Negative for shortness of breath.   Genitourinary:  Negative for pelvic pain and vaginal bleeding.   Maternal Medical History:  Reason for admission:  IOL for GDM   Fetal activity: Perceived fetal activity is normal.   Last perceived fetal movement was within the past hour.   Prenatal complications: No bleeding, PIH or placental abnormality.   Prenatal Complications - Diabetes: gestational. Diabetes is managed by diet.   Metformin ordered, did not take   Dilation: 3 Effacement (%): 50 Station: -2, -1 Exam by:: Caroline Kappert RN Blood pressure 118/76, pulse 91, temperature 98.2 F (36.8 C), temperature source Oral, resp. rate 20, last menstrual period 01/19/2022, currently breastfeeding. Maternal Exam:  Uterine Assessment: Contraction strength is mild.  Contraction frequency is irregular.  Abdomen: Patient reports no abdominal tenderness. Introitus: Normal vulva. Normal vagina.  Ferning test: not done.  Nitrazine test: not done. Pelvis: adequate for delivery.   Cervix: Cervix evaluated by digital exam.     Fetal Exam Fetal Monitor Review: Mode: ultrasound.   Baseline rate: 140.  Variability: moderate (6-25 bpm).   Pattern: accelerations present and no decelerations.   Fetal State Assessment: Category I - tracings are normal.   Physical Exam Constitutional:      General: She is not in acute distress.    Appearance: She is not ill-appearing or toxic-appearing.  HENT:     Head: Normocephalic.  Cardiovascular:     Rate and Rhythm: Normal rate.  Pulmonary:     Effort: Pulmonary effort is normal.  Abdominal:     Tenderness: There is no abdominal tenderness.  Genitourinary:  General: Normal vulva.  Musculoskeletal:        General: Normal range of motion.     Cervical back: Normal range of motion.  Skin:    General: Skin is warm and dry.  Neurological:     General: No focal deficit present.     Mental Status: She is alert.  Psychiatric:        Mood and Affect: Mood normal.     Prenatal labs: ABO, Rh: --/--/PENDING (05/10 0100) Antibody: PENDING (05/10 0100) Rubella: Immune (12/21 0000) RPR: Non Reactive (03/06  1051)  HBsAg: Negative (01/16 0000)  HIV: Non Reactive (03/06 1051)  GBS: Negative/-- (05/02 1622)   Assessment/Plan: Single IUP at [redacted]w[redacted]d Gestational Diabetes, Meformin ordered LGA  81%ile GBS Negative Latent vs Prodromal labor   Admit to Labor and Delivery Routine orders Low Dose Pitocin  Wynelle Bourgeois 10/10/2022, 1:40 AM

## 2022-10-10 NOTE — Discharge Summary (Shared)
     Postpartum Discharge Summary  Date of Service updated***     Patient Name: Caroline [name] DOB: 1986-05-24 MRN: [id number]  Date of admission: 10/10/2022 Delivery date:10/10/2022  Delivering provider: Jacklyn Shell  Date of discharge: 10/10/2022  Admitting diagnosis: GDM, class A2 [O24.419] Intrauterine pregnancy: [redacted]w[redacted]d     Secondary diagnosis:  Principal Problem:   GDM, class A2  Additional problems: ***    Discharge diagnosis: Term Pregnancy Delivered and GDM A2                                              Post partum procedures:{Postpartum procedures:23558} Augmentation: Pitocin Complications: None  Hospital course: Induction of Labor With Vaginal Delivery   37 y.o. yo [phone] at [redacted]w[redacted]d was admitted to the hospital 10/10/2022 for induction of labor.  Indication for induction: A2 DM.  Patient had an labor course complicated by nothing Membrane Rupture Time/Date: 5:00 AM ,10/10/2022   Delivery Method:Vaginal, Spontaneous  Episiotomy: None  Lacerations:  None  Details of delivery can be found in separate delivery note.  Patient had a postpartum course complicated by***. Patient is discharged home 10/10/22.  Newborn Data: Birth date:10/10/2022  Birth time:6:44 AM  Gender:Female  Living status:Living  Apgars:8 ,9  Weight:   Magnesium Sulfate received: No BMZ received: No Rhophylac:N/A MMR:N/A T-DaP: declined Flu: No Transfusion:{Transfusion received:30440034}  Physical exam  Vitals:   10/10/22 0624 10/10/22 0655 10/10/22 0701 10/10/22 0716  BP: 120/70 115/70 115/66 110/81  Pulse: 79 75 81   Resp:   20 20  Temp:      TempSrc:      SpO2:      Weight:      Height:       General: {Exam; general:21111117} Lochia: {Desc; appropriate/inappropriate:30686::"appropriate"} Uterine Fundus: {Desc; firm/soft:30687} Incision: {Exam; incision:21111123} DVT Evaluation: {Exam; dvt:2111122} Labs: Lab Results  Component Value Date   WBC 7.2 10/10/2022    HGB 13.5 10/10/2022   HCT 39.1 10/10/2022   MCV 92.4 10/10/2022   PLT 138 (L) 10/10/2022      Latest Ref Rng & Units 08/02/2022   10:37 AM  CMP  Glucose 70 - 99 mg/dL 130   BUN 6 - 20 mg/dL 6   Creatinine 8.65 - 7.84 mg/dL 6.96   Sodium 295 - 284 mmol/L 136   Potassium 3.5 - 5.1 mmol/L 3.2   Chloride 98 - 111 mmol/L 102   CO2 22 - 32 mmol/L 24   Calcium 8.9 - 10.3 mg/dL 8.4   Total Protein 6.5 - 8.1 g/dL 5.6   Total Bilirubin 0.3 - 1.2 mg/dL 0.4   Alkaline Phos 38 - 126 U/L 58   AST 15 - 41 U/L 30   ALT 0 - 44 U/L 24    Edinburgh Score:     No data to display           After visit meds:  Allergies as of 10/10/2022   No Known Allergies   Med Rec must be completed prior to using this [facility]***        Discharge home in stable condition Infant Feeding: {Baby feeding:23562} Infant Disposition:{CHL IP OB HOME WITH XLKGMW:10272} Discharge instruction: per After Visit Summary and Postpartum booklet. Activity: Advance as tolerated. Pelvic rest for 6 weeks.  Diet: {OB ZDGU:44034742} Future Appointments:No future appointments.   10/10/2022 Jacklyn Shell, CNM

## 2022-10-10 NOTE — Anesthesia Procedure Notes (Signed)
Epidural Patient location during procedure: OB Start time: 10/10/2022 5:40 AM End time: 10/10/2022 5:58 AM  Staffing Anesthesiologist: Lowella Curb, MD Performed: anesthesiologist   Preanesthetic Checklist Completed: patient identified, IV checked, site marked, risks and benefits discussed, surgical consent, monitors and equipment checked, pre-op evaluation and timeout performed  Epidural Patient position: sitting Prep: ChloraPrep Patient monitoring: heart rate, cardiac monitor, continuous pulse ox and blood pressure Approach: midline Location: L2-L3 Injection technique: LOR saline  Needle:  Needle type: Tuohy  Needle gauge: 17 G Needle length: 9 cm Needle insertion depth: 7 cm Catheter type: closed end flexible Catheter size: 20 Guage Catheter at skin depth: 11 cm Test dose: negative  Assessment Events: blood not aspirated, injection not painful, no injection resistance, no paresthesia and negative IV test  Additional Notes Reason for block:procedure for pain

## 2022-10-10 NOTE — Anesthesia Preprocedure Evaluation (Signed)
Anesthesia Evaluation  Patient identified by MRN, date of birth, ID band Patient awake    Reviewed: Allergy & Precautions, NPO status , Patient's Chart, lab work & pertinent test results  History of Anesthesia Complications Negative for: history of anesthetic complications  Airway Mallampati: II  TM Distance: >3 FB Neck ROM: Full    Dental no notable dental hx. (+) Dental Advisory Given   Pulmonary neg pulmonary ROS   Pulmonary exam normal        Cardiovascular negative cardio ROS Normal cardiovascular exam     Neuro/Psych negative neurological ROS  negative psych ROS   GI/Hepatic Neg liver ROS,,,  Endo/Other  negative endocrine ROSdiabetes    Renal/GU negative Renal ROS  negative genitourinary   Musculoskeletal negative musculoskeletal ROS (+)    Abdominal   Peds negative pediatric ROS (+)  Hematology negative hematology ROS (+)   Anesthesia Other Findings   Reproductive/Obstetrics (+) Pregnancy                             Anesthesia Physical Anesthesia Plan  ASA: II  Anesthesia Plan: Epidural   Post-op Pain Management:    Induction: Intravenous  PONV Risk Score and Plan:   Airway Management Planned:   Additional Equipment:   Intra-op Plan:   Post-operative Plan:   Informed Consent: I have reviewed the patients History and Physical, chart, labs and discussed the procedure including the risks, benefits and alternatives for the proposed anesthesia with the patient or authorized representative who has indicated his/her understanding and acceptance.     Dental advisory given  Plan Discussed with: Anesthesiologist  Anesthesia Plan Comments:         Anesthesia Quick Evaluation

## 2022-10-10 NOTE — Progress Notes (Signed)
Patient ID: Caroline [name], female   DOB: 1986-03-31, 36 y.o.   MRN: [id number] Resting  Denies feeling significant pain  Vitals:   10/10/22 0023 10/10/22 0137 10/10/22 0321  BP: 118/76  118/71  Pulse: 91  77  Resp: 20  20  Temp: 98.2 F (36.8 C)    TempSrc: Oral    Weight:  91.2 kg   Height:  5\' 4"  (1.626 m)    FHR reassuring UCs regular  Cervix Deferred

## 2022-10-10 NOTE — Anesthesia Postprocedure Evaluation (Signed)
Anesthesia Post Note  Patient: Caroline [name]  Procedure(s) Performed: AN AD HOC LABOR EPIDURAL     Patient location during evaluation: Mother Baby Anesthesia Type: Epidural Level of consciousness: awake and alert Pain management: pain level controlled Vital Signs Assessment: post-procedure vital signs reviewed and stable Respiratory status: spontaneous breathing Cardiovascular status: stable Postop Assessment: no headache, no backache, epidural receding, no apparent nausea or vomiting, patient able to bend at knees, able to ambulate and adequate PO intake Anesthetic complications: no   No notable events documented.  Last Vitals:  Vitals:   10/10/22 1216 10/10/22 1524  BP: 114/79 108/70  Pulse: 76 70  Resp: 18 18  Temp: 36.7 C 36.7 C  SpO2:      Last Pain:  Vitals:   10/10/22 1945  TempSrc:   PainSc: 0-No pain   Pain Goal:                   Lorrene Reid

## 2022-10-10 NOTE — Plan of Care (Signed)

## 2022-10-11 ENCOUNTER — Other Ambulatory Visit: Payer: Self-pay

## 2022-10-11 MED ORDER — FERROUS SULFATE 325 (65 FE) MG PO TABS: 325.0000 mg | ORAL_TABLET | ORAL | 3 refills | Status: AC

## 2022-10-11 MED ORDER — ACETAMINOPHEN 325 MG PO TABS: 650.0000 mg | ORAL_TABLET | ORAL | 0 refills | Status: AC | PRN

## 2022-10-11 MED ORDER — IBUPROFEN 600 MG PO TABS: 600.0000 mg | ORAL_TABLET | Freq: Four times a day (QID) | ORAL | 0 refills | Status: AC

## 2022-10-21 ENCOUNTER — Telehealth (HOSPITAL_COMMUNITY): Payer: Self-pay | Admitting: *Deleted

## 2022-10-21 NOTE — Telephone Encounter (Signed)
Attempted Hospital Discharge Follow-Up Call with help of interpreter "Myriam Jacobson [phone]".  Left voice mail requesting that patient return RN's phone call if patient has any concerns or questions.

## 2022-11-20 ENCOUNTER — Encounter: Payer: Self-pay | Admitting: General Practice

## 2022-12-17 IMAGING — US US ABDOMEN LIMITED
1 series · 14 of 25 positions shown · non-contrast
Comparison: 07/20/2016

CLINICAL DATA: Right upper quadrant abdominal pain.
Cholecystectomy.

EXAM:
ULTRASOUND ABDOMEN LIMITED RIGHT UPPER QUADRANT

[Series 1: us abdomen limited ruq (liver/gb) · 14 of 38 slices shown]
[im 1/38]
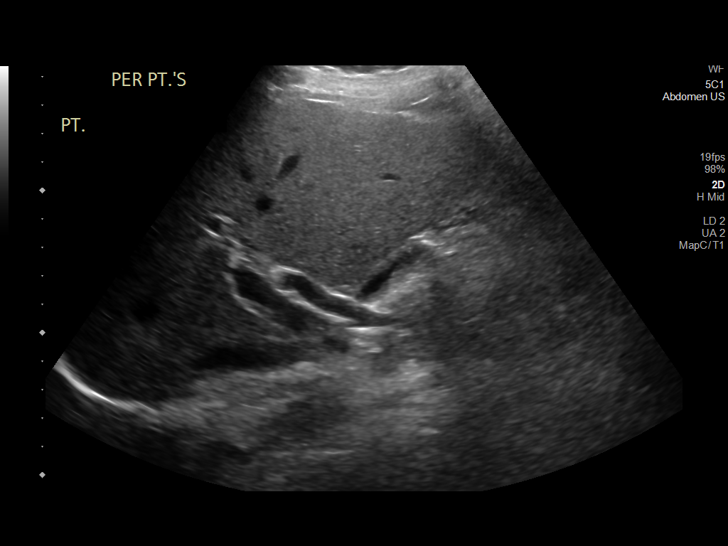
[im 4/38]
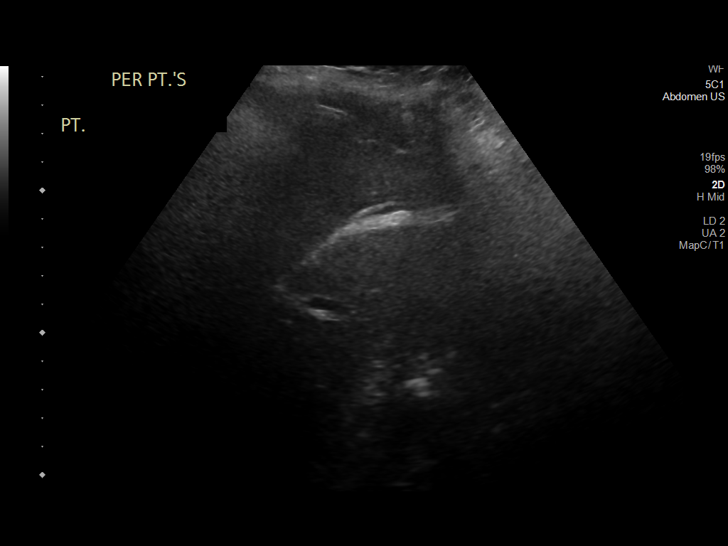
[im 7/38]
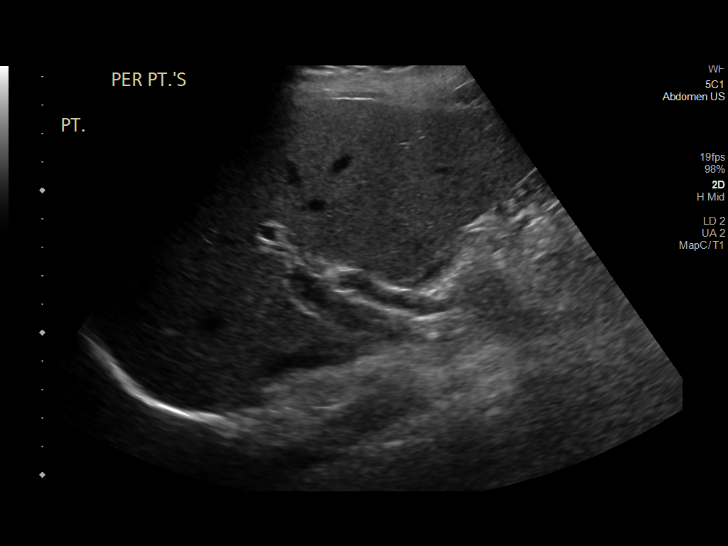
[im 10/38]
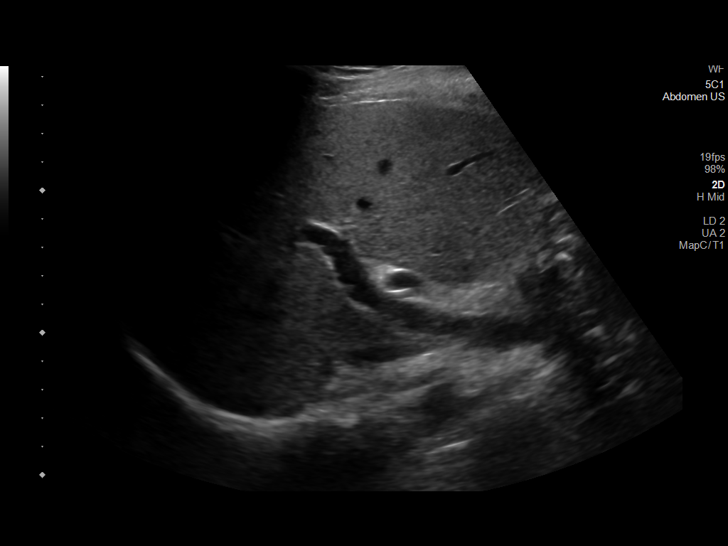
[im 13/38]
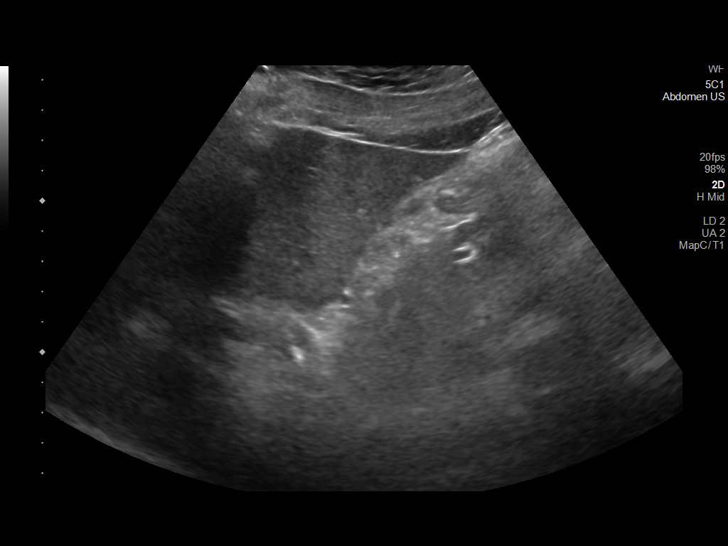
[im 14/38]
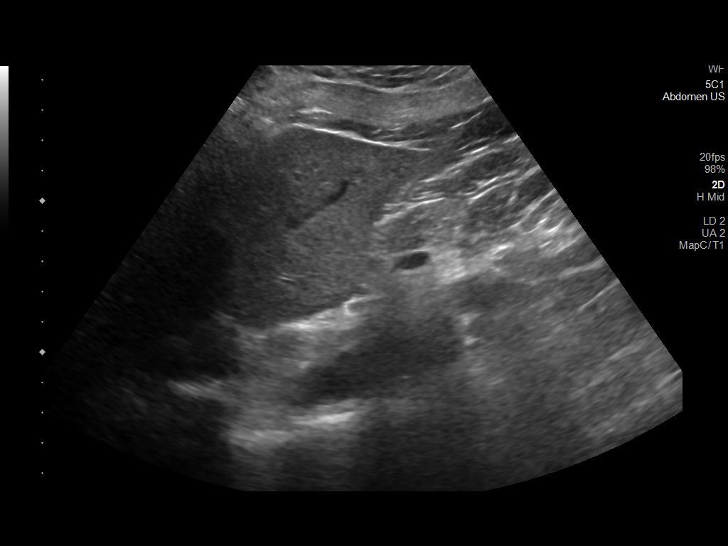
[im 17/38]
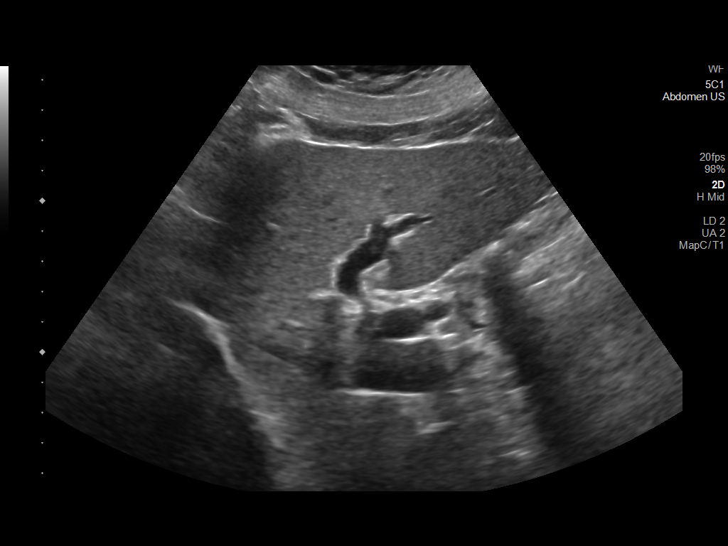
[im 21/38]
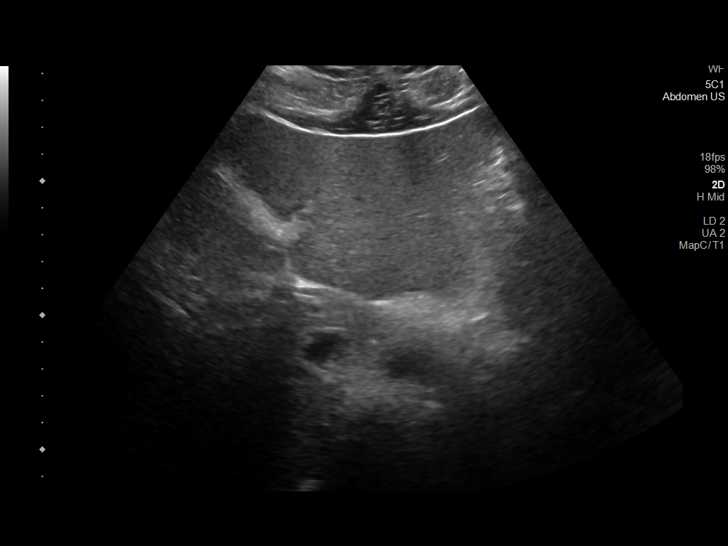
[im 24/38]
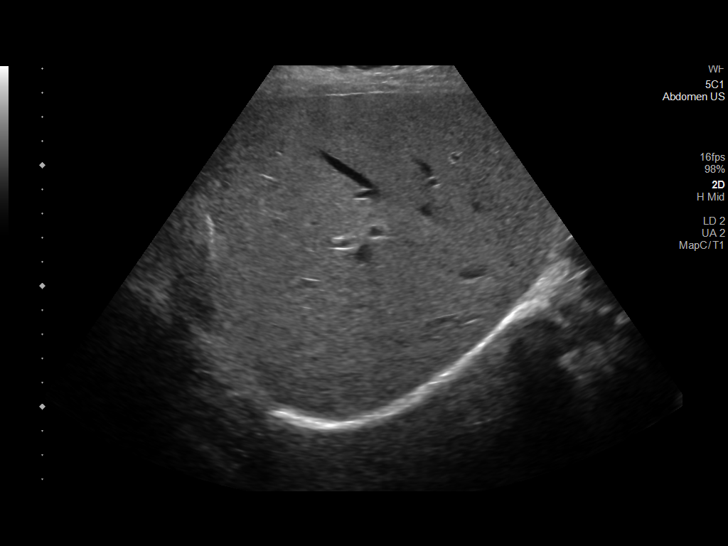
[im 25/38]
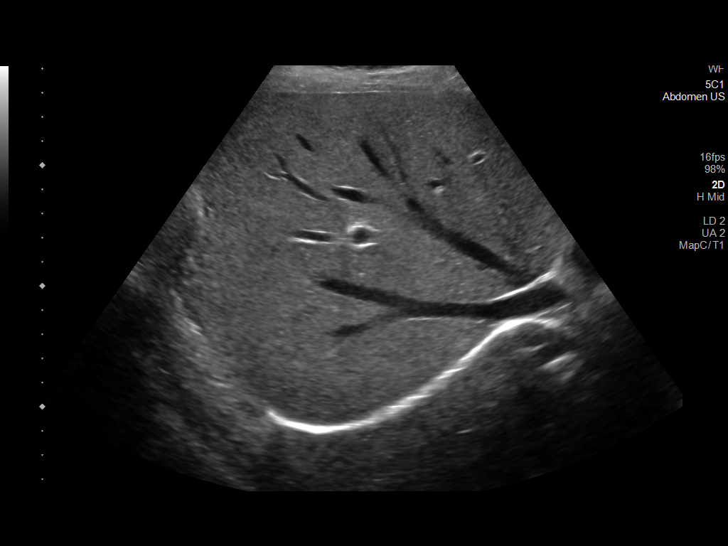
[im 28/38]
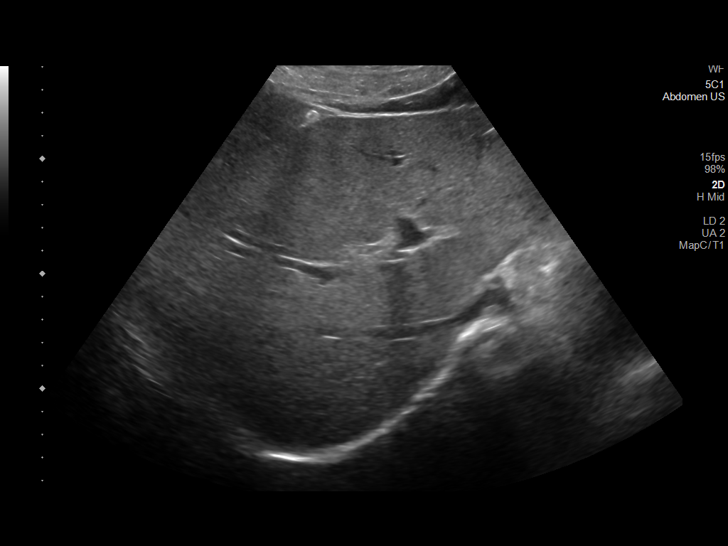
[im 31/38]
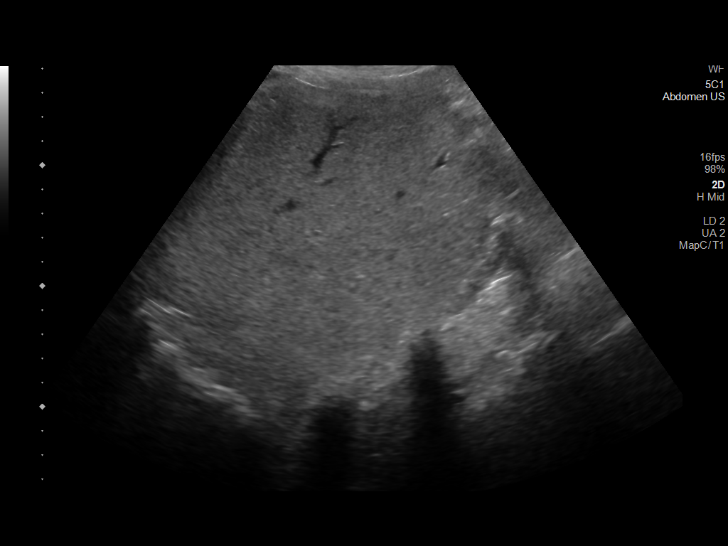
[im 34/38]
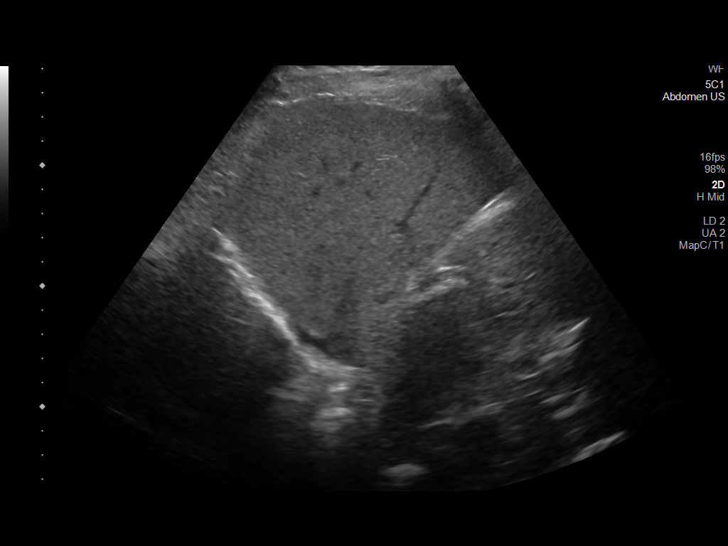
[im 38/38]
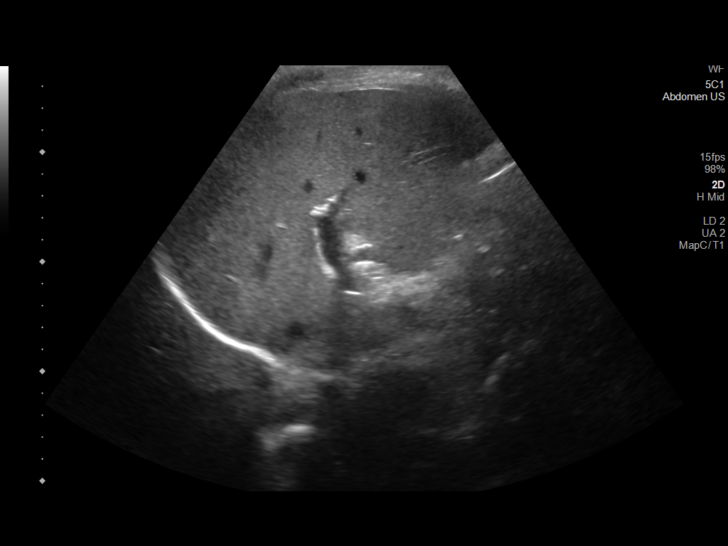

[14 of 25 positions shown; findings below may reference images not displayed]

FINDINGS: Gallbladder:

Surgically absent.

Common bile duct:

Diameter: 5 mm

Liver:

Increased parenchymal echogenicity suggestive of hepatic steatosis.
No focal liver abnormality. Portal vein is patent on color Doppler
imaging with normal direction of blood flow towards the liver.

Other: None.
IMPRESSION: 1. No acute abnormality.
2. Hepatic steatosis.
3. Cholecystectomy.
